# Patient Record
Sex: Male | Born: 1970
Health system: Southern US, Community
[De-identification: ages and names within clinical notes are randomized; demographics above are authoritative.]

## PROBLEM LIST (undated history)

## (undated) DIAGNOSIS — B019 Varicella without complication: Secondary | ICD-10-CM

## (undated) DIAGNOSIS — T7840XA Allergy, unspecified, initial encounter: Secondary | ICD-10-CM

## (undated) DIAGNOSIS — R7989 Other specified abnormal findings of blood chemistry: Secondary | ICD-10-CM

## (undated) DIAGNOSIS — I10 Essential (primary) hypertension: Secondary | ICD-10-CM

## (undated) DIAGNOSIS — F1011 Alcohol abuse, in remission: Secondary | ICD-10-CM

## (undated) DIAGNOSIS — F32A Depression, unspecified: Secondary | ICD-10-CM

## (undated) DIAGNOSIS — K5792 Diverticulitis of intestine, part unspecified, without perforation or abscess without bleeding: Secondary | ICD-10-CM

## (undated) DIAGNOSIS — F329 Major depressive disorder, single episode, unspecified: Secondary | ICD-10-CM

## (undated) HISTORY — DX: Essential (primary) hypertension: I10

## (undated) HISTORY — DX: Other specified abnormal findings of blood chemistry: R79.89

## (undated) HISTORY — DX: Depression, unspecified: F32.A

## (undated) HISTORY — DX: Varicella without complication: B01.9

## (undated) HISTORY — DX: Alcohol abuse, in remission: F10.11

## (undated) HISTORY — PX: ANKLE SURGERY: SHX546

## (undated) HISTORY — DX: Diverticulitis of intestine, part unspecified, without perforation or abscess without bleeding: K57.92

## (undated) HISTORY — DX: Allergy, unspecified, initial encounter: T78.40XA

## (undated) HISTORY — DX: Major depressive disorder, single episode, unspecified: F32.9

---

## 2014-09-17 ENCOUNTER — Other Ambulatory Visit: Payer: Self-pay | Admitting: Unknown Physician Specialty

## 2015-03-12 ENCOUNTER — Encounter: Payer: Self-pay | Admitting: Family Medicine

## 2015-03-12 ENCOUNTER — Ambulatory Visit (INDEPENDENT_AMBULATORY_CARE_PROVIDER_SITE_OTHER): Payer: Federal, State, Local not specified - PPO | Admitting: Family Medicine

## 2015-03-12 VITALS — BP 122/88 | HR 65 | Temp 97.9°F | Ht 75.5 in | Wt 265.5 lb

## 2015-03-12 DIAGNOSIS — Z130 Encounter for screening for diseases of the blood and blood-forming organs and certain disorders involving the immune mechanism: Secondary | ICD-10-CM

## 2015-03-12 DIAGNOSIS — M25511 Pain in right shoulder: Secondary | ICD-10-CM | POA: Diagnosis not present

## 2015-03-12 DIAGNOSIS — I10 Essential (primary) hypertension: Secondary | ICD-10-CM

## 2015-03-12 DIAGNOSIS — Z125 Encounter for screening for malignant neoplasm of prostate: Secondary | ICD-10-CM

## 2015-03-12 DIAGNOSIS — E669 Obesity, unspecified: Secondary | ICD-10-CM | POA: Diagnosis not present

## 2015-03-12 DIAGNOSIS — F32A Depression, unspecified: Secondary | ICD-10-CM | POA: Insufficient documentation

## 2015-03-12 DIAGNOSIS — F329 Major depressive disorder, single episode, unspecified: Secondary | ICD-10-CM

## 2015-03-12 DIAGNOSIS — Z13220 Encounter for screening for lipoid disorders: Secondary | ICD-10-CM | POA: Diagnosis not present

## 2015-03-12 DIAGNOSIS — Z0000 Encounter for general adult medical examination without abnormal findings: Secondary | ICD-10-CM | POA: Diagnosis not present

## 2015-03-12 DIAGNOSIS — R21 Rash and other nonspecific skin eruption: Secondary | ICD-10-CM | POA: Diagnosis not present

## 2015-03-12 DIAGNOSIS — R7989 Other specified abnormal findings of blood chemistry: Secondary | ICD-10-CM

## 2015-03-12 DIAGNOSIS — E291 Testicular hypofunction: Secondary | ICD-10-CM

## 2015-03-12 LAB — LIPID PANEL
CHOL/HDL RATIO: 5
Cholesterol: 194 mg/dL (ref 0–200)
HDL: 37.8 mg/dL — AB (ref >39.00)
LDL Cholesterol: 126 mg/dL — ABNORMAL HIGH (ref 0–99)
NONHDL: 155.8
Triglycerides: 150 mg/dL — ABNORMAL HIGH (ref 0.0–149.0)
VLDL: 30 mg/dL (ref 0.0–40.0)

## 2015-03-12 LAB — CBC
HEMATOCRIT: 48.3 % (ref 39.0–52.0)
HEMOGLOBIN: 16 g/dL (ref 13.0–17.0)
MCHC: 33.1 g/dL (ref 30.0–36.0)
MCV: 84.8 fl (ref 78.0–100.0)
Platelets: 278 10*3/uL (ref 150.0–400.0)
RBC: 5.7 Mil/uL (ref 4.22–5.81)
RDW: 14.4 % (ref 11.5–15.5)
WBC: 6.5 10*3/uL (ref 4.0–10.5)

## 2015-03-12 LAB — COMPREHENSIVE METABOLIC PANEL
ALK PHOS: 93 U/L (ref 39–117)
ALT: 29 U/L (ref 0–53)
AST: 23 U/L (ref 0–37)
Albumin: 4.7 g/dL (ref 3.5–5.2)
BUN: 25 mg/dL — ABNORMAL HIGH (ref 6–23)
CO2: 30 meq/L (ref 19–32)
Calcium: 9.9 mg/dL (ref 8.4–10.5)
Chloride: 102 mEq/L (ref 96–112)
Creatinine, Ser: 0.95 mg/dL (ref 0.40–1.50)
GFR: 90.56 mL/min (ref >60.00)
GLUCOSE: 107 mg/dL — AB (ref 70–99)
POTASSIUM: 5.2 meq/L — AB (ref 3.5–5.1)
Sodium: 140 mEq/L (ref 135–145)
Total Bilirubin: 0.6 mg/dL (ref 0.2–1.2)
Total Protein: 7.1 g/dL (ref 6.0–8.3)

## 2015-03-12 LAB — HEMOGLOBIN A1C: Hgb A1c MFr Bld: 5.9 % (ref 4.6–6.5)

## 2015-03-12 LAB — PSA: PSA: 0.98 ng/mL (ref 0.10–4.00)

## 2015-03-12 MED ORDER — BENAZEPRIL HCL 10 MG PO TABS: 10.0000 mg | ORAL_TABLET | Freq: Every day | ORAL | Status: DC

## 2015-03-12 MED ORDER — CITALOPRAM HYDROBROMIDE 20 MG PO TABS: 20.0000 mg | ORAL_TABLET | Freq: Every day | ORAL | Status: DC

## 2015-03-12 MED ORDER — BETAMETHASONE VALERATE 0.1 % EX OINT: 1.0000 "application " | TOPICAL_OINTMENT | Freq: Two times a day (BID) | CUTANEOUS | Status: DC

## 2015-03-12 MED ORDER — ETODOLAC 500 MG PO TABS: 500.0000 mg | ORAL_TABLET | Freq: Two times a day (BID) | ORAL | Status: DC | PRN

## 2015-03-12 NOTE — Patient Instructions (Addendum)
It was nice to see you today.  Take the celexa as prescribed.   Continue your Benazepril  Let me know about your testosterone stuff (I can manage it or you can go back to [facility]).  Use the ointment for you finger twice daily for the next 1-2 weeks.  Use the Etodolac as needed (sparingly).  Bernard Blake  Follow up:  Return in about 6 weeks (around 04/23/2015).  Take care  Dr. Lacinda Axon

## 2015-03-12 NOTE — Assessment & Plan Note (Signed)
Well-controlled. PHQ9 - 9. Starting Celexa. Patient to follow-up in 6 weeks.

## 2015-03-12 NOTE — Assessment & Plan Note (Signed)
Patient declined flu shot. Screening labs today: CBC, CMP, lipid, A1c. Obtaining PSA given patient has been on testosterone therapy. Not a candidate for tetanus immunization as he is allergic.

## 2015-03-12 NOTE — Progress Notes (Signed)
Subjective:  Patient ID: Bernard Blake, male    DOB: 1970-07-06  Age: 44 y.o. MRN: [id number]  CC: Establish care, Depression, Rash, Shoulder pain  HPI Bernard Blake is a 44 y.o. male presents to the clinic today to establish care. He also has additional complaints (see below).  Preventative Healthcare  Colonoscopy: N/A.  Immunizations  Tetanus - Allergic; not a candidate.  Pneumococcal - N/A.  Flu - Declined.  Prostate cancer screening: PSA today as patient has been on testosterone therapy.  Labs: In need of screening labs.  Exercise: Exercises regularly.  Alcohol use: Former alcoholic. Sober.  Smoking/tobacco use: Former smoker. Current uses dip/snuff.  Regular dental exams: Yes.   Wears seat belt: Yes.   Depression  Patient has long-standing depression.  He has been on Effexor in the past. He has been out of it for approximately 1 month.  He states that he has not had much benefit from Effexor.  Depression is currently not well controlled and interferes with his relationship with his wife.  He would like to discuss other options today.  Rash  Patient reports recent development of a rash on his right middle finger.  No known inciting factor.  He does not wear a ring on that finger.  He's been using a topical steroid that he was given previously with some improvement (he has not used this regularly).  He reports it is mildly itchy.  No other associated symptoms.  No exacerbating factors.  No new exposures.  Right shoulder pain  Patient reports long-standing history of intermittent right shoulder pain.  He states that associated with decreased range of motion.  Worsened with physical activity.  He states that he's had relief with anti-inflammatories in the past.  No recent fall, trauma, injury.  PMH, Surgical Hx, Family Hx, Social History reviewed and updated as below.  Past Medical History  Diagnosis Date  . Chicken pox   . History  of alcohol abuse   . Depression   . Hypertension   . Allergy   . Low testosterone    Past Surgical History  Procedure Laterality Date  . Ankle surgery Left     BB removal   Family History  Problem Relation Age of Onset  . Arthritis Mother   . Hyperlipidemia Mother   . Hypertension Mother   . Arthritis Father   . Hyperlipidemia Father   . Hypertension Father   . Diabetes Father    Social History  Substance Use Topics  . Smoking status: Former Smoker -- 1.00 packs/day for 8 years    Types: Cigarettes  . Smokeless tobacco: Current User    Types: Snuff     Comment: Quit smoking in 1995.  Marland Kitchen Alcohol Use: No     Comment: Former alcoholic; now sober    Review of Systems  Eyes: Positive for visual disturbance.  Musculoskeletal:       Shoulder pain.  Skin: Positive for rash.  Neurological: Positive for weakness.  Psychiatric/Behavioral:       Stress, Sadness.  All other systems reviewed and are negative.   Objective:   Today's Vitals: BP 122/88 mmHg  Pulse 65  Temp(Src) 97.9 F (36.6 C) (Oral)  Ht 6' 3.5" (1.918 m)  Wt 265 lb 8 oz (120.43 kg)  BMI 32.74 kg/m2  SpO2 95%  Physical Exam  Constitutional: He is oriented to person, place, and time. He appears well-developed and well-nourished. No distress.  HENT:  Head: Normocephalic and atraumatic.  Nose: Nose normal.  Mouth/Throat: Oropharynx is clear and moist. No oropharyngeal exudate.  Right TM obscured by cerumen. Left TM normal.  Eyes: Conjunctivae are normal. No scleral icterus.  Neck: Neck supple.  Cardiovascular: Normal rate and regular rhythm.   No murmur heard. Pulmonary/Chest: Effort normal and breath sounds normal. He has no wheezes. He has no rales.  Abdominal: Soft. He exhibits no distension. There is no tenderness. There is no rebound and no guarding.  Musculoskeletal:  Shoulder: Inspection reveals no abnormalities, atrophy or asymmetry. Palpation is normal. ROM - Decreased in  extension. Rotator cuff strength normal throughout. No signs of impingement with negative Neer and Hawkin's tests, empty can. Normal scapular function observed. No painful arc sign.    Lymphadenopathy:    He has no cervical adenopathy.  Neurological: He is alert and oriented to person, place, and time.  Skin:  Right middle finger - dry papular rash.  Psychiatric: He has a normal mood and affect.  Vitals reviewed.  PHQ-9 - 9.  Assessment & Plan:   Problem List Items Addressed This Visit    Depression    Well-controlled. PHQ9 - 9. Starting Celexa. Patient to follow-up in 6 weeks.      Relevant Medications   citalopram (CELEXA) 20 MG tablet   Essential hypertension    Well-controlled. Continuing benazepril.      Relevant Medications   benazepril (LOTENSIN) 10 MG tablet   Other Relevant Orders   Comp Met (CMET) (Completed)   Low testosterone   Obesity (BMI 30.0-34.9)   Relevant Orders   HgB A1c (Completed)   Preventative health care - Primary    Patient declined flu shot. Screening labs today: CBC, CMP, lipid, A1c. Obtaining PSA given patient has been on testosterone therapy. Not a candidate for tetanus immunization as he is allergic.       Rash    New problem. Treating with Betamethasone.      Right shoulder pain    Exam negative for impingement. No evidence of cuff tear. Treating with Etodolac PRN.       Other Visit Diagnoses    Screening for deficiency anemia        Relevant Orders    CBC (Completed)    Screening, lipid        Relevant Orders    Lipid panel (Completed)    Screening for prostate cancer        Relevant Orders    PSA (Completed)       Outpatient Encounter Prescriptions as of 03/12/2015  Medication Sig  . Ascorbic Acid (VITAMIN C) 1000 MG tablet Take 2,000 mg by mouth daily.  . benazepril (LOTENSIN) 10 MG tablet Take 1 tablet (10 mg total) by mouth daily.  . cholecalciferol (VITAMIN D) 1000 UNITS tablet Take 2,000 Units by  mouth daily.  Marland Kitchen glucosamine-chondroitin 500-400 MG tablet Take 1 tablet by mouth 3 (three) times daily.  . Omega-3 Fatty Acids (FISH OIL) 1000 MG CAPS Take 2,400 mg by mouth once.  . testosterone (ANDRODERM) 4 MG/24HR PT24 patch Place 1 patch onto the skin daily.  . Zinc Sulfate (ZINC 15) 66 MG TABS Take 15 mg by mouth.  . [DISCONTINUED] benazepril (LOTENSIN) 10 MG tablet Take 10 mg by mouth daily.  . [DISCONTINUED] venlafaxine XR (EFFEXOR-XR) 75 MG 24 hr capsule TAKE ONE CAPSULE BY MOUTH EVERY DAY  . betamethasone valerate ointment (VALISONE) 0.1 % Apply 1 application topically 2 (two) times daily. For 1 week. Do not use more than 2 weeks consecutively.  Marland Kitchen  citalopram (CELEXA) 20 MG tablet Take 1 tablet (20 mg total) by mouth daily.  Marland Kitchen etodolac (LODINE) 500 MG tablet Take 1 tablet (500 mg total) by mouth 2 (two) times daily as needed.   No facility-administered encounter medications on file as of 03/12/2015.    Follow-up: Return in about 6 weeks (around 04/23/2015).  [place]

## 2015-03-12 NOTE — Assessment & Plan Note (Signed)
New problem. Treating with Betamethasone.  

## 2015-03-12 NOTE — Assessment & Plan Note (Signed)
Exam negative for impingement. No evidence of cuff tear. Treating with Etodolac PRN.

## 2015-03-12 NOTE — Assessment & Plan Note (Signed)
Well-controlled. Continuing benazepril.

## 2015-04-23 ENCOUNTER — Ambulatory Visit (INDEPENDENT_AMBULATORY_CARE_PROVIDER_SITE_OTHER): Payer: Federal, State, Local not specified - PPO | Admitting: Family Medicine

## 2015-04-23 ENCOUNTER — Encounter: Payer: Self-pay | Admitting: Family Medicine

## 2015-04-23 VITALS — BP 122/78 | HR 60 | Temp 97.5°F | Ht 75.5 in | Wt 257.1 lb

## 2015-04-23 DIAGNOSIS — F329 Major depressive disorder, single episode, unspecified: Secondary | ICD-10-CM | POA: Diagnosis not present

## 2015-04-23 DIAGNOSIS — I10 Essential (primary) hypertension: Secondary | ICD-10-CM | POA: Diagnosis not present

## 2015-04-23 DIAGNOSIS — R21 Rash and other nonspecific skin eruption: Secondary | ICD-10-CM | POA: Diagnosis not present

## 2015-04-23 DIAGNOSIS — F32A Depression, unspecified: Secondary | ICD-10-CM

## 2015-04-23 MED ORDER — BETAMETHASONE VALERATE 0.1 % EX OINT: 1.0000 "application " | TOPICAL_OINTMENT | Freq: Two times a day (BID) | CUTANEOUS | Status: DC

## 2015-04-23 NOTE — Progress Notes (Signed)
Subjective:  Patient ID: Bernard Blake, male    DOB: 05-31-1970  Age: 45 y.o. MRN: [id number]  CC: Follow up Depression, HTN, Rash  HPI:  45 year old male with a past medical history of hypertension and depression presents for follow-up.  HTN  Well controlled.  Doing well on Benazepril.  Depression  There are last visit patient was switched from Effexor to Celexa.  He states that he's doing well currently.  He says that he's had a few days that is been bad but generally he is doing well.  Tolerating the Celexa without difficulty.  Rash  Patient continues to have a rash on his right middle finger.  He did not pick up the prescription that was given at our last visit.  He has been using a prior topical steroid with some improvement.  Social Hx   Social History   Social History  . Marital Status: Married    Spouse Name: N/A  . Number of Children: N/A  . Years of Education: N/A   Social History Main Topics  . Smoking status: Former Smoker -- 1.00 packs/day for 8 years    Types: Cigarettes  . Smokeless tobacco: Current User    Types: Snuff     Comment: Quit smoking in 1995.  Marland Kitchen Alcohol Use: No     Comment: Former alcoholic; now sober  . Drug Use: No  . Sexual Activity: Yes   Other Topics Concern  . None   Social History Narrative   Review of Systems  Constitutional: Negative.   Respiratory: Negative.   Cardiovascular: Negative.   Skin: Positive for rash.   Objective:  BP 122/78 mmHg  Pulse 60  Temp(Src) 97.5 F (36.4 C) (Oral)  Ht 6' 3.5" (1.918 m)  Wt 257 lb 2 oz (116.631 kg)  BMI 31.70 kg/m2  SpO2 97%  BP/Weight 04/23/2015 [id number]  Systolic BP 123XX123 123XX123  Diastolic BP 78 88  Wt. (Lbs) 257.13 265.5  BMI 31.7 32.74   Physical Exam  Constitutional: He appears well-developed. No distress.  Cardiovascular: Normal rate and regular rhythm.   No murmur heard. Pulmonary/Chest: Effort normal and breath sounds normal. No respiratory distress.    Skin:  Papular dry rash noted on Right middle finger.  Psychiatric:  Normal Mood and affect.  Vitals reviewed.  Lab Results  Component Value Date   WBC 6.5 03/12/2015   HGB 16.0 03/12/2015   HCT 48.3 03/12/2015   PLT 278.0 03/12/2015   GLUCOSE 107* 03/12/2015   CHOL 194 03/12/2015   TRIG 150.0* 03/12/2015   HDL 37.80* 03/12/2015   LDLCALC 126* 03/12/2015   ALT 29 03/12/2015   AST 23 03/12/2015   NA 140 03/12/2015   K 5.2* 03/12/2015   CL 102 03/12/2015   CREATININE 0.95 03/12/2015   BUN 25* 03/12/2015   CO2 30 03/12/2015   PSA 0.98 03/12/2015   HGBA1C 5.9 03/12/2015    Assessment & Plan:   Problem List Items Addressed This Visit    Depression - Primary    Stable and doing well at this time. Continue Celexa.      Essential hypertension    Well controlled on benazepril. Will continue.      Rash    Established problem, not improved. We will resend in prescription for betamethasone.         Meds ordered this encounter  Medications  . Co-Enzyme Q-10 100 MG CAPS    Sig: Take 100 mg by mouth.  . betamethasone valerate ointment (VALISONE)  0.1 %    Sig: Apply 1 application topically 2 (two) times daily. For 1 week. Do not use more than 2 weeks consecutively.    Dispense:  30 g    Refill:  0    Follow-up: 6 months - 1 year.   [place]

## 2015-04-23 NOTE — Assessment & Plan Note (Signed)
Established problem, not improved. We will resend in prescription for betamethasone.

## 2015-04-23 NOTE — Progress Notes (Signed)
Pre visit review using our clinic review tool, if applicable. No additional management support is needed unless otherwise documented below in the visit note. 

## 2015-04-23 NOTE — Patient Instructions (Signed)
Continue your current medications.  You're doing great.  Follow up in 6 months to 1 year.   Take care  Dr. Lacinda Axon

## 2015-04-23 NOTE — Assessment & Plan Note (Signed)
Stable and doing well at this time. Continue Celexa.

## 2015-04-23 NOTE — Assessment & Plan Note (Signed)
Well controlled on benazepril. Will continue.

## 2015-06-30 ENCOUNTER — Encounter: Payer: Self-pay | Admitting: Family Medicine

## 2015-06-30 ENCOUNTER — Other Ambulatory Visit: Payer: Self-pay | Admitting: Family Medicine

## 2015-06-30 MED ORDER — BENAZEPRIL HCL 10 MG PO TABS: 10.0000 mg | ORAL_TABLET | Freq: Every day | ORAL | Status: DC

## 2015-06-30 MED ORDER — CITALOPRAM HYDROBROMIDE 20 MG PO TABS: 20.0000 mg | ORAL_TABLET | Freq: Every day | ORAL | Status: DC

## 2015-07-14 ENCOUNTER — Telehealth: Payer: Self-pay | Admitting: *Deleted

## 2015-07-14 MED ORDER — ETODOLAC 500 MG PO TABS: 500.0000 mg | ORAL_TABLET | Freq: Two times a day (BID) | ORAL | Status: DC | PRN

## 2015-07-14 NOTE — Telephone Encounter (Signed)
Rx refilled.

## 2015-07-14 NOTE — Telephone Encounter (Signed)
Patient requested a medication refill for etodolac, please contact pt when completed Pharmacy [facility] on [address]

## 2015-07-16 DIAGNOSIS — B353 Tinea pedis: Secondary | ICD-10-CM | POA: Diagnosis not present

## 2015-07-16 DIAGNOSIS — M71372 Other bursal cyst, left ankle and foot: Secondary | ICD-10-CM | POA: Diagnosis not present

## 2015-11-11 DIAGNOSIS — N5201 Erectile dysfunction due to arterial insufficiency: Secondary | ICD-10-CM | POA: Diagnosis not present

## 2015-11-11 DIAGNOSIS — Z79899 Other long term (current) drug therapy: Secondary | ICD-10-CM | POA: Diagnosis not present

## 2015-11-11 DIAGNOSIS — R350 Frequency of micturition: Secondary | ICD-10-CM | POA: Diagnosis not present

## 2015-11-11 DIAGNOSIS — E291 Testicular hypofunction: Secondary | ICD-10-CM | POA: Diagnosis not present

## 2015-11-11 DIAGNOSIS — N401 Enlarged prostate with lower urinary tract symptoms: Secondary | ICD-10-CM | POA: Diagnosis not present

## 2015-11-11 DIAGNOSIS — N421 Congestion and hemorrhage of prostate: Secondary | ICD-10-CM | POA: Diagnosis not present

## 2016-02-10 ENCOUNTER — Encounter: Payer: Self-pay | Admitting: Family Medicine

## 2016-02-10 ENCOUNTER — Other Ambulatory Visit: Payer: Self-pay | Admitting: Family Medicine

## 2016-02-10 MED ORDER — IBUPROFEN 800 MG PO TABS
800.0000 mg | ORAL_TABLET | Freq: Three times a day (TID) | ORAL | 0 refills | Status: DC | PRN
Start: 2016-02-10 — End: 2016-04-01

## 2016-02-19 ENCOUNTER — Other Ambulatory Visit: Payer: Self-pay | Admitting: Family Medicine

## 2016-04-01 ENCOUNTER — Other Ambulatory Visit: Payer: Self-pay | Admitting: Family Medicine

## 2016-05-13 DIAGNOSIS — N401 Enlarged prostate with lower urinary tract symptoms: Secondary | ICD-10-CM | POA: Diagnosis not present

## 2016-05-13 DIAGNOSIS — E291 Testicular hypofunction: Secondary | ICD-10-CM | POA: Diagnosis not present

## 2016-05-13 DIAGNOSIS — Z79899 Other long term (current) drug therapy: Secondary | ICD-10-CM | POA: Diagnosis not present

## 2016-05-13 DIAGNOSIS — N5201 Erectile dysfunction due to arterial insufficiency: Secondary | ICD-10-CM | POA: Diagnosis not present

## 2016-06-25 ENCOUNTER — Other Ambulatory Visit: Payer: Self-pay | Admitting: Family Medicine

## 2016-07-10 ENCOUNTER — Other Ambulatory Visit: Payer: Self-pay | Admitting: Family Medicine

## 2016-07-11 ENCOUNTER — Other Ambulatory Visit: Payer: Self-pay | Admitting: Family Medicine

## 2016-07-13 ENCOUNTER — Encounter: Payer: Federal, State, Local not specified - PPO | Admitting: Family Medicine

## 2016-08-04 ENCOUNTER — Telehealth: Payer: Self-pay | Admitting: Family Medicine

## 2016-08-04 ENCOUNTER — Encounter: Payer: Federal, State, Local not specified - PPO | Admitting: Family Medicine

## 2016-08-04 DIAGNOSIS — Z0289 Encounter for other administrative examinations: Secondary | ICD-10-CM

## 2016-08-04 NOTE — Telephone Encounter (Signed)
FYI - Pt called and stated that he had something come up at work and missed appointment.

## 2016-08-04 NOTE — Telephone Encounter (Signed)
Pt will be no showed due to him not calling until 9 minutes ago.

## 2016-08-30 ENCOUNTER — Encounter: Payer: Federal, State, Local not specified - PPO | Admitting: Family Medicine

## 2016-09-15 ENCOUNTER — Ambulatory Visit (INDEPENDENT_AMBULATORY_CARE_PROVIDER_SITE_OTHER): Payer: Federal, State, Local not specified - PPO | Admitting: Family Medicine

## 2016-09-15 ENCOUNTER — Encounter: Payer: Self-pay | Admitting: Family Medicine

## 2016-09-15 VITALS — BP 124/86 | HR 54 | Temp 97.7°F | Resp 16 | Ht 74.0 in | Wt 255.4 lb

## 2016-09-15 DIAGNOSIS — Z0000 Encounter for general adult medical examination without abnormal findings: Secondary | ICD-10-CM | POA: Diagnosis not present

## 2016-09-15 DIAGNOSIS — M71372 Other bursal cyst, left ankle and foot: Secondary | ICD-10-CM | POA: Diagnosis not present

## 2016-09-15 LAB — CBC
HEMATOCRIT: 47.4 % (ref 39.0–52.0)
HEMOGLOBIN: 15.9 g/dL (ref 13.0–17.0)
MCHC: 33.7 g/dL (ref 30.0–36.0)
MCV: 84.5 fl (ref 78.0–100.0)
Platelets: 263 10*3/uL (ref 150.0–400.0)
RBC: 5.6 Mil/uL (ref 4.22–5.81)
RDW: 14 % (ref 11.5–15.5)
WBC: 6 10*3/uL (ref 4.0–10.5)

## 2016-09-15 LAB — COMPREHENSIVE METABOLIC PANEL
ALT: 27 U/L (ref 0–53)
AST: 22 U/L (ref 0–37)
Albumin: 4.8 g/dL (ref 3.5–5.2)
Alkaline Phosphatase: 86 U/L (ref 39–117)
BUN: 20 mg/dL (ref 6–23)
CHLORIDE: 100 meq/L (ref 96–112)
CO2: 33 mEq/L — ABNORMAL HIGH (ref 19–32)
Calcium: 9.8 mg/dL (ref 8.4–10.5)
Creatinine, Ser: 0.9 mg/dL (ref 0.40–1.50)
GFR: 95.76 mL/min (ref >60.00)
GLUCOSE: 114 mg/dL — AB (ref 70–99)
POTASSIUM: 4.4 meq/L (ref 3.5–5.1)
SODIUM: 137 meq/L (ref 135–145)
Total Bilirubin: 0.6 mg/dL (ref 0.2–1.2)
Total Protein: 7.1 g/dL (ref 6.0–8.3)

## 2016-09-15 LAB — HEMOGLOBIN A1C: Hgb A1c MFr Bld: 5.8 % (ref 4.6–6.5)

## 2016-09-15 LAB — LIPID PANEL
Cholesterol: 175 mg/dL (ref 0–200)
HDL: 43.5 mg/dL (ref >39.00)
LDL CALC: 110 mg/dL — AB (ref 0–99)
NONHDL: 131.09
Total CHOL/HDL Ratio: 4
Triglycerides: 103 mg/dL (ref 0.0–149.0)
VLDL: 20.6 mg/dL (ref 0.0–40.0)

## 2016-09-15 MED ORDER — IBUPROFEN 800 MG PO TABS: 800.0000 mg | ORAL_TABLET | Freq: Three times a day (TID) | ORAL | 1 refills | Status: DC | PRN

## 2016-09-15 MED ORDER — CITALOPRAM HYDROBROMIDE 20 MG PO TABS: ORAL_TABLET | ORAL | 3 refills | Status: DC

## 2016-09-15 MED ORDER — BENAZEPRIL HCL 10 MG PO TABS: 10.0000 mg | ORAL_TABLET | Freq: Every day | ORAL | 3 refills | Status: DC

## 2016-09-15 NOTE — Progress Notes (Addendum)
Subjective:  Patient ID: Bernard Blake, male    DOB: [birth year]  Age: 46 y.o. MRN: [id number]  CC: Annual physical  HPI Bernard Blake is a 46 y.o. male presents to the clinic today for an annual physical exam.  Preventative Healthcare  Colonoscopy: Not yet indicated.  Immunizations - Up to date.  Prostate cancer screening: See urology.   Labs: Labs today.  Exercise: Exercises regularly.  Alcohol use: See below.  Smoking/tobacco use: Former smoker.  STD/HIV testing: Declines.  PMH, Surgical Hx, Family Hx, Social History reviewed and updated as below.  Past Medical History:  Diagnosis Date  . Allergy   . Chicken pox   . Depression   . History of alcohol abuse   . Hypertension   . Low testosterone    Past Surgical History:  Procedure Laterality Date  . ANKLE SURGERY Left    BB removal   Family History  Problem Relation Age of Onset  . Arthritis Mother   . Hyperlipidemia Mother   . Hypertension Mother   . Arthritis Father   . Hyperlipidemia Father   . Hypertension Father   . Diabetes Father    Social History  Substance Use Topics  . Smoking status: Former Smoker    Packs/day: 1.00    Years: 8.00    Types: Cigarettes  . Smokeless tobacco: Current User    Types: Snuff     Comment: Quit smoking in 1995.  Marland Kitchen Alcohol use No     Comment: Former alcoholic; now sober    Review of Systems  Musculoskeletal: Positive for arthralgias.  Skin:       Lesion on Left 2nd toe.   Psychiatric/Behavioral:       Sadness.  All other systems reviewed and are negative.  Objective:   Today's Vitals: BP 124/86 (BP Location: Left Arm, Patient Position: Sitting, Cuff Size: Large)   Pulse (!) 54   Temp 97.7 F (36.5 C) (Oral)   Resp 16   Ht 6\' 2"  (1.88 m)   Wt 255 lb 6 oz (115.8 kg)   SpO2 95%   BMI 32.79 kg/m   Physical Exam  Constitutional: He is oriented to person, place, and time. He appears well-developed and well-nourished. No distress.  HENT:  Head:  Normocephalic and atraumatic.  Nose: Nose normal.  Mouth/Throat: Oropharynx is clear and moist. No oropharyngeal exudate.  Normal TM's bilaterally.   Eyes: Conjunctivae are normal. No scleral icterus.  Neck: Neck supple.  Cardiovascular: Normal rate and regular rhythm.   No murmur heard. Pulmonary/Chest: Effort normal and breath sounds normal. He has no wheezes. He has no rales.  Abdominal: Soft. He exhibits no distension. There is no tenderness. There is no rebound and no guarding.  Musculoskeletal: Normal range of motion. He exhibits no edema.  Lymphadenopathy:    He has no cervical adenopathy.  Neurological: He is alert and oriented to person, place, and time.  Skin: Skin is warm and dry. No rash noted.  Psychiatric: He has a normal mood and affect.  Vitals reviewed.  Assessment & Plan:   Problem List Items Addressed This Visit      Other   Annual physical exam - Primary    Preventative healthcare up-to-date. Labs today. Medications refilled.      Relevant Orders   CBC   Hemoglobin A1c   Comprehensive metabolic panel   Lipid panel      Meds ordered this encounter  Medications  . DISCONTD: ibuprofen (ADVIL,MOTRIN) 800 MG tablet  Sig: Take 800 mg by mouth every 8 (eight) hours as needed.  . benazepril (LOTENSIN) 10 MG tablet    Sig: Take 1 tablet (10 mg total) by mouth daily.    Dispense:  90 tablet    Refill:  3  . citalopram (CELEXA) 20 MG tablet    Sig: TAKE 1 TABLET(20 MG) BY MOUTH DAILY    Dispense:  90 tablet    Refill:  3  . ibuprofen (ADVIL,MOTRIN) 800 MG tablet    Sig: Take 1 tablet (800 mg total) by mouth every 8 (eight) hours as needed.    Dispense:  90 tablet    Refill:  1    Follow-up: Annually  [place]

## 2016-09-15 NOTE — Patient Instructions (Addendum)
Continue your meds.  Follow up annually.  Take care  Dr. Lacinda Axon     Health Maintenance, Male A healthy lifestyle and preventive care is important for your health and wellness. Ask your health care provider about what schedule of regular examinations is right for you. What should I know about weight and diet? Eat a Healthy Diet  Eat plenty of vegetables, fruits, whole grains, low-fat dairy products, and lean protein.  Do not eat a lot of foods high in solid fats, added sugars, or salt.  Maintain a Healthy Weight Regular exercise can help you achieve or maintain a healthy weight. You should:  Do at least 150 minutes of exercise each week. The exercise should increase your heart rate and make you sweat (moderate-intensity exercise).  Do strength-training exercises at least twice a week.  Watch Your Levels of Cholesterol and Blood Lipids  Have your blood tested for lipids and cholesterol every 5 years starting at 48 years of age. If you are at high risk for heart disease, you should start having your blood tested when you are 48 years old. You may need to have your cholesterol levels checked more often if: ? Your lipid or cholesterol levels are high. ? You are older than 48 years of age. ? You are at high risk for heart disease.  What should I know about cancer screening? Many types of cancers can be detected early and may often be prevented. Lung Cancer  You should be screened every year for lung cancer if: ? You are a current smoker who has smoked for at least 30 years. ? You are a former smoker who has quit within the past 15 years.  Talk to your health care provider about your screening options, when you should start screening, and how often you should be screened.  Colorectal Cancer  Routine colorectal cancer screening usually begins at 48 years of age and should be repeated every 5-10 years until you are 48 years old. You may need to be screened more often if early forms  of precancerous polyps or small growths are found. Your health care provider may recommend screening at an earlier age if you have risk factors for colon cancer.  Your health care provider may recommend using home test kits to check for hidden blood in the stool.  A small camera at the end of a tube can be used to examine your colon (sigmoidoscopy or colonoscopy). This checks for the earliest forms of colorectal cancer.  Prostate and Testicular Cancer  Depending on your age and overall health, your health care provider may do certain tests to screen for prostate and testicular cancer.  Talk to your health care provider about any symptoms or concerns you have about testicular or prostate cancer.  Skin Cancer  Check your skin from head to toe regularly.  Tell your health care provider about any new moles or changes in moles, especially if: ? There is a change in a mole's size, shape, or color. ? You have a mole that is larger than a pencil eraser.  Always use sunscreen. Apply sunscreen liberally and repeat throughout the day.  Protect yourself by wearing long sleeves, pants, a wide-brimmed hat, and sunglasses when outside.  What should I know about heart disease, diabetes, and high blood pressure?  If you are 74-38 years of age, have your blood pressure checked every 3-5 years. If you are 6 years of age or older, have your blood pressure checked every year. You should  have your blood pressure measured twice-once when you are at a hospital or clinic, and once when you are not at a hospital or clinic. Record the average of the two measurements. To check your blood pressure when you are not at a hospital or clinic, you can use: ? An automated blood pressure machine at a pharmacy. ? A home blood pressure monitor.  Talk to your health care provider about your target blood pressure.  If you are between 35-35 years old, ask your health care provider if you should take aspirin to prevent heart  disease.  Have regular diabetes screenings by checking your fasting blood sugar level. ? If you are at a normal weight and have a low risk for diabetes, have this test once every three years after the age of 72. ? If you are overweight and have a high risk for diabetes, consider being tested at a younger age or more often.  A one-time screening for abdominal aortic aneurysm (AAA) by ultrasound is recommended for men aged 20-75 years who are current or former smokers. What should I know about preventing infection? Hepatitis B If you have a higher risk for hepatitis B, you should be screened for this virus. Talk with your health care provider to find out if you are at risk for hepatitis B infection. Hepatitis C Blood testing is recommended for:  Everyone born from [birth year] through 1967.  Anyone with known risk factors for hepatitis C.  Sexually Transmitted Diseases (STDs)  You should be screened each year for STDs including gonorrhea and chlamydia if: ? You are sexually active and are younger than 48 years of age. ? You are older than 48 years of age and your health care provider tells you that you are at risk for this type of infection. ? Your sexual activity has changed since you were last screened and you are at an increased risk for chlamydia or gonorrhea. Ask your health care provider if you are at risk.  Talk with your health care provider about whether you are at high risk of being infected with HIV. Your health care provider may recommend a prescription medicine to help prevent HIV infection.  What else can I do?  Schedule regular health, dental, and eye exams.  Stay current with your vaccines (immunizations).  Do not use any tobacco products, such as cigarettes, chewing tobacco, and e-cigarettes. If you need help quitting, ask your health care provider.  Limit alcohol intake to no more than 2 drinks per day. One drink equals 12 ounces of beer, 5 ounces of wine, or 1 ounces of  hard liquor.  Do not use street drugs.  Do not share needles.  Ask your health care provider for help if you need support or information about quitting drugs.  Tell your health care provider if you often feel depressed.  Tell your health care provider if you have ever been abused or do not feel safe at home. This information is not intended to replace advice given to you by your health care provider. Make sure you discuss any questions you have with your health care provider. Document Released: 09/04/2007 Document Revised: 11/05/2015 Document Reviewed: 12/10/2014 Elsevier Interactive Patient Education  [employer].

## 2016-09-15 NOTE — Assessment & Plan Note (Signed)
Preventative healthcare up-to-date. Labs today. Medications refilled.

## 2016-09-30 ENCOUNTER — Encounter: Payer: Self-pay | Admitting: *Deleted

## 2016-09-30 ENCOUNTER — Telehealth: Payer: Self-pay | Admitting: Family Medicine

## 2016-09-30 ENCOUNTER — Ambulatory Visit (INDEPENDENT_AMBULATORY_CARE_PROVIDER_SITE_OTHER): Payer: Federal, State, Local not specified - PPO | Admitting: *Deleted

## 2016-09-30 DIAGNOSIS — H6120 Impacted cerumen, unspecified ear: Secondary | ICD-10-CM

## 2016-09-30 NOTE — Telephone Encounter (Signed)
No problem. I'm happy to look.

## 2016-09-30 NOTE — Progress Notes (Signed)
Patient came in for ear irrigation today to bilateral ears no order had PCP inspect ears and received verbal for bilateral irrigation for impacted cerumen , after irrigating the right ear for several minutes was not able to clear cerumen had PCP re-evaluate his ear  And advised patient to iether come after using debrox or ENT referral patient wanted to return for nurse visit in 2 weeks.

## 2016-09-30 NOTE — Progress Notes (Signed)
Debrox trial with repeat irrigation.  [place]

## 2016-09-30 NOTE — Telephone Encounter (Signed)
Patient coming to office for ear irrigation nothing in note about cerumen impaction from last visit please advise . Need you to look at ears for cerumen and give order  09/15/16 was date of last visit.

## 2016-10-19 ENCOUNTER — Ambulatory Visit (INDEPENDENT_AMBULATORY_CARE_PROVIDER_SITE_OTHER): Payer: Federal, State, Local not specified - PPO

## 2016-10-19 DIAGNOSIS — M7732 Calcaneal spur, left foot: Secondary | ICD-10-CM | POA: Diagnosis not present

## 2016-10-19 DIAGNOSIS — M722 Plantar fascial fibromatosis: Secondary | ICD-10-CM | POA: Diagnosis not present

## 2016-10-19 DIAGNOSIS — M79672 Pain in left foot: Secondary | ICD-10-CM | POA: Diagnosis not present

## 2016-10-19 DIAGNOSIS — H6120 Impacted cerumen, unspecified ear: Secondary | ICD-10-CM

## 2016-10-19 DIAGNOSIS — M6740 Ganglion, unspecified site: Secondary | ICD-10-CM | POA: Diagnosis not present

## 2016-10-19 NOTE — Progress Notes (Signed)
Patient comes in today with bilateral ear cerumen impaction. Patient was seen on 09/30/16 for irrigation Was given debrox for him to use and after several weeks patient was able to get have it removed. It took patient 1 attempt on the right ear and 2 attempts on the left ear. Patient tolerated procedure well. Stated there was no pain after procedure and advise patient he may want to consult with Dr. Lacinda Axon on his next office visit to see ENT.

## 2016-10-20 NOTE — Progress Notes (Signed)
Care was provided under my supervision. I agree with the management as indicated in the note.  Iness Pangilinan DO  

## 2016-11-01 DIAGNOSIS — M778 Other enthesopathies, not elsewhere classified: Secondary | ICD-10-CM | POA: Diagnosis not present

## 2016-11-30 DIAGNOSIS — M659 Synovitis and tenosynovitis, unspecified: Secondary | ICD-10-CM | POA: Diagnosis not present

## 2016-11-30 DIAGNOSIS — M7672 Peroneal tendinitis, left leg: Secondary | ICD-10-CM | POA: Diagnosis not present

## 2016-11-30 DIAGNOSIS — M6740 Ganglion, unspecified site: Secondary | ICD-10-CM | POA: Diagnosis not present

## 2017-03-07 DIAGNOSIS — J329 Chronic sinusitis, unspecified: Secondary | ICD-10-CM | POA: Diagnosis not present

## 2017-03-10 ENCOUNTER — Ambulatory Visit: Payer: Federal, State, Local not specified - PPO | Admitting: Internal Medicine

## 2017-05-25 DIAGNOSIS — R351 Nocturia: Secondary | ICD-10-CM | POA: Diagnosis not present

## 2017-05-25 DIAGNOSIS — Z79899 Other long term (current) drug therapy: Secondary | ICD-10-CM | POA: Diagnosis not present

## 2017-05-25 DIAGNOSIS — N401 Enlarged prostate with lower urinary tract symptoms: Secondary | ICD-10-CM | POA: Diagnosis not present

## 2017-05-25 DIAGNOSIS — E291 Testicular hypofunction: Secondary | ICD-10-CM | POA: Diagnosis not present

## 2017-05-25 DIAGNOSIS — N5201 Erectile dysfunction due to arterial insufficiency: Secondary | ICD-10-CM | POA: Diagnosis not present

## 2017-08-01 DIAGNOSIS — M9905 Segmental and somatic dysfunction of pelvic region: Secondary | ICD-10-CM | POA: Diagnosis not present

## 2017-08-01 DIAGNOSIS — M9903 Segmental and somatic dysfunction of lumbar region: Secondary | ICD-10-CM | POA: Diagnosis not present

## 2017-08-01 DIAGNOSIS — M5136 Other intervertebral disc degeneration, lumbar region: Secondary | ICD-10-CM | POA: Diagnosis not present

## 2017-08-01 DIAGNOSIS — M62830 Muscle spasm of back: Secondary | ICD-10-CM | POA: Diagnosis not present

## 2017-08-05 DIAGNOSIS — M9903 Segmental and somatic dysfunction of lumbar region: Secondary | ICD-10-CM | POA: Diagnosis not present

## 2017-08-05 DIAGNOSIS — M9905 Segmental and somatic dysfunction of pelvic region: Secondary | ICD-10-CM | POA: Diagnosis not present

## 2017-08-05 DIAGNOSIS — M5136 Other intervertebral disc degeneration, lumbar region: Secondary | ICD-10-CM | POA: Diagnosis not present

## 2017-08-05 DIAGNOSIS — M62830 Muscle spasm of back: Secondary | ICD-10-CM | POA: Diagnosis not present

## 2017-08-17 DIAGNOSIS — M9903 Segmental and somatic dysfunction of lumbar region: Secondary | ICD-10-CM | POA: Diagnosis not present

## 2017-08-17 DIAGNOSIS — M5136 Other intervertebral disc degeneration, lumbar region: Secondary | ICD-10-CM | POA: Diagnosis not present

## 2017-08-17 DIAGNOSIS — M9905 Segmental and somatic dysfunction of pelvic region: Secondary | ICD-10-CM | POA: Diagnosis not present

## 2017-08-17 DIAGNOSIS — M62830 Muscle spasm of back: Secondary | ICD-10-CM | POA: Diagnosis not present

## 2017-08-25 DIAGNOSIS — M9903 Segmental and somatic dysfunction of lumbar region: Secondary | ICD-10-CM | POA: Diagnosis not present

## 2017-08-25 DIAGNOSIS — M9905 Segmental and somatic dysfunction of pelvic region: Secondary | ICD-10-CM | POA: Diagnosis not present

## 2017-08-25 DIAGNOSIS — M62830 Muscle spasm of back: Secondary | ICD-10-CM | POA: Diagnosis not present

## 2017-08-25 DIAGNOSIS — M5136 Other intervertebral disc degeneration, lumbar region: Secondary | ICD-10-CM | POA: Diagnosis not present

## 2017-09-12 DIAGNOSIS — M9903 Segmental and somatic dysfunction of lumbar region: Secondary | ICD-10-CM | POA: Diagnosis not present

## 2017-09-12 DIAGNOSIS — M5136 Other intervertebral disc degeneration, lumbar region: Secondary | ICD-10-CM | POA: Diagnosis not present

## 2017-09-12 DIAGNOSIS — M9905 Segmental and somatic dysfunction of pelvic region: Secondary | ICD-10-CM | POA: Diagnosis not present

## 2017-09-12 DIAGNOSIS — M62830 Muscle spasm of back: Secondary | ICD-10-CM | POA: Diagnosis not present

## 2017-09-14 DIAGNOSIS — M71372 Other bursal cyst, left ankle and foot: Secondary | ICD-10-CM | POA: Diagnosis not present

## 2017-09-14 DIAGNOSIS — D2272 Melanocytic nevi of left lower limb, including hip: Secondary | ICD-10-CM | POA: Diagnosis not present

## 2017-09-14 DIAGNOSIS — D2262 Melanocytic nevi of left upper limb, including shoulder: Secondary | ICD-10-CM | POA: Diagnosis not present

## 2017-09-14 DIAGNOSIS — D2261 Melanocytic nevi of right upper limb, including shoulder: Secondary | ICD-10-CM | POA: Diagnosis not present

## 2017-09-14 DIAGNOSIS — D225 Melanocytic nevi of trunk: Secondary | ICD-10-CM | POA: Diagnosis not present

## 2017-10-17 ENCOUNTER — Other Ambulatory Visit: Payer: Self-pay | Admitting: Family Medicine

## 2017-10-21 ENCOUNTER — Other Ambulatory Visit: Payer: Self-pay | Admitting: Family Medicine

## 2017-11-01 DIAGNOSIS — N401 Enlarged prostate with lower urinary tract symptoms: Secondary | ICD-10-CM | POA: Diagnosis not present

## 2017-11-01 DIAGNOSIS — Z79899 Other long term (current) drug therapy: Secondary | ICD-10-CM | POA: Diagnosis not present

## 2017-11-01 DIAGNOSIS — E291 Testicular hypofunction: Secondary | ICD-10-CM | POA: Diagnosis not present

## 2017-11-01 DIAGNOSIS — N5201 Erectile dysfunction due to arterial insufficiency: Secondary | ICD-10-CM | POA: Diagnosis not present

## 2017-12-13 DIAGNOSIS — E291 Testicular hypofunction: Secondary | ICD-10-CM | POA: Diagnosis not present

## 2017-12-13 DIAGNOSIS — N5201 Erectile dysfunction due to arterial insufficiency: Secondary | ICD-10-CM | POA: Diagnosis not present

## 2017-12-13 DIAGNOSIS — N401 Enlarged prostate with lower urinary tract symptoms: Secondary | ICD-10-CM | POA: Diagnosis not present

## 2017-12-26 ENCOUNTER — Other Ambulatory Visit: Payer: Self-pay | Admitting: Family Medicine

## 2017-12-26 DIAGNOSIS — R062 Wheezing: Secondary | ICD-10-CM | POA: Diagnosis not present

## 2017-12-26 DIAGNOSIS — J069 Acute upper respiratory infection, unspecified: Secondary | ICD-10-CM | POA: Diagnosis not present

## 2017-12-28 NOTE — Telephone Encounter (Signed)
Call pt I refiled celexa and lotensin   Please educate pt that he should only take ibuprofen ( particularly 800mg ) rarely as when taken with celexa can increase his risk for GIB.

## 2017-12-28 NOTE — Telephone Encounter (Signed)
Pt has appt on 01/16/18 to transfer care. Okay to refill?

## 2018-01-03 NOTE — Telephone Encounter (Signed)
Patient advised to take ibuprofen 800 mg rarely due to GIB he verbalized an understanding.

## 2018-01-16 ENCOUNTER — Encounter: Payer: Self-pay | Admitting: Family

## 2018-01-16 ENCOUNTER — Ambulatory Visit: Payer: Federal, State, Local not specified - PPO | Admitting: Family

## 2018-01-16 VITALS — BP 122/74 | HR 76 | Temp 98.7°F | Resp 16 | Ht 75.0 in | Wt 262.8 lb

## 2018-01-16 DIAGNOSIS — F329 Major depressive disorder, single episode, unspecified: Secondary | ICD-10-CM | POA: Diagnosis not present

## 2018-01-16 DIAGNOSIS — I10 Essential (primary) hypertension: Secondary | ICD-10-CM | POA: Diagnosis not present

## 2018-01-16 DIAGNOSIS — F32A Depression, unspecified: Secondary | ICD-10-CM

## 2018-01-16 MED ORDER — CITALOPRAM HYDROBROMIDE 20 MG PO TABS: 40.0000 mg | ORAL_TABLET | Freq: Every day | ORAL | 1 refills | Status: DC

## 2018-01-16 NOTE — Assessment & Plan Note (Signed)
Largely stable. Concerned for winter months. Trial of increased celexa, counseling Close follow up 2 months.

## 2018-01-16 NOTE — Patient Instructions (Addendum)
Referral to counseling  Trial increase of celexa to 40mg  total once per day.   Fasting labs when you can.   Essentials for good sleep:   #1 Exercise #2 Limit Caffeine ( no caffeine after lunch) #3 No smart phones, TV prior to bed -- BLUE light is VERY activating and send the brain an 'awake message.'  #4 Go to bed at same time of night each night and get up at same time of day.  #5 Take 0.5 to 5mg  melatonin at 7pm with dinner -this is when natural melatonin will start to increase #6 May try over the counter Unisom for sleep aid   Close follow up

## 2018-01-16 NOTE — Assessment & Plan Note (Signed)
At goal. No changes made today. 

## 2018-01-16 NOTE — Progress Notes (Signed)
Subjective:    Patient ID: Bernard Blake, male    DOB: [birth year], 47 y.o.   MRN: [id number]  CC: Bernard Blake is a 47 y.o. male who presents today for follow up.   HPI: Depression- on celexa. Has been on couple of years. struggles more in cold months. Happier when outside.  Doing well however still has days when depressed. Did counseling in the past which was helpful. No hospitalizations for depression or mental health. No  suicide attempts. no si/hi. Sleeps 5-6 hours per night. Feels keyed up from work and stays up with daughter. No trouble falling asleep. Looks at phone for a while. Some anxiety about the next day.   Recovering alcohol 10 years ago. Self medicating for depression at that time. Sober since.   HTN- compliant with medication. Doesn't  Check BP at home. Denies exertional chest pain or pressure, numbness or tingling radiating to left arm or jaw, palpitations, dizziness, frequent headaches, changes in vision, or shortness of breath.   Walks 10,000 steps per day. 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   Follows with urology for testosterone autopen replacement. Can see a little improvement with mood on medication.   PSA done by urology.   Due for CPE    HISTORY:  Past Medical History:  Diagnosis Date  . Allergy   . Chicken pox   . Depression   . History of alcohol abuse   . Hypertension   . Low testosterone    Past Surgical History:  Procedure Laterality Date  . ANKLE SURGERY Left    BB removal   Family History  Problem Relation Age of Onset  . Arthritis Mother   . Hyperlipidemia Mother   . Hypertension Mother   . Arthritis Father   . Hyperlipidemia Father   . Hypertension Father   . Diabetes Father     Allergies: Tetanus toxoids Current Outpatient Medications on File Prior to Visit  Medication Sig Dispense Refill  . Ascorbic Acid (VITAMIN C) 1000 MG tablet Take 2,000 mg by mouth daily.    . benazepril (LOTENSIN) 10 MG tablet TAKE 1 TABLET(10 MG) BY MOUTH DAILY 90  tablet 0  . betamethasone valerate ointment (VALISONE) 0.1 % Apply 1 application topically 2 (two) times daily. For 1 week. Do not use more than 2 weeks consecutively. 30 g 0  . cholecalciferol (VITAMIN D) 1000 UNITS tablet Take 2,000 Units by mouth daily.    Marland Kitchen Co-Enzyme Q-10 100 MG CAPS Take 100 mg by mouth.    Marland Kitchen glucosamine-chondroitin 500-400 MG tablet Take 1 tablet by mouth 3 (three) times daily.    Marland Kitchen ibuprofen (ADVIL,MOTRIN) 800 MG tablet Take 1 tablet (800 mg total) by mouth every 8 (eight) hours as needed. 90 tablet 1  . Omega-3 Fatty Acids (FISH OIL) 1000 MG CAPS Take 2,400 mg by mouth once.    . XYOSTED 75 MG/0.5ML SOAJ INJECT 0.5 ML SUBCUTANEOUSLY ONCE EVERY WEEK  5  . Zinc Sulfate (ZINC 15) 66 MG TABS Take 15 mg by mouth.     No current facility-administered medications on file prior to visit.     Social History   Tobacco Use  . Smoking status: Former Smoker    Packs/day: 1.00    Years: 8.00    Pack years: 8.00    Types: Cigarettes  . Smokeless tobacco: Current User    Types: Snuff  . Tobacco comment: Quit smoking in 1995.  Substance Use Topics  . Alcohol use: No    Alcohol/week: 0.0  standard drinks    Comment: Former alcoholic; now sober  . Drug use: No    Review of Systems  Constitutional: Negative for chills and fever.  Respiratory: Negative for cough.   Cardiovascular: Negative for chest pain and palpitations.  Gastrointestinal: Negative for nausea and vomiting.  Psychiatric/Behavioral: Negative for sleep disturbance and suicidal ideas. The patient is nervous/anxious.       Objective:    BP 122/74 (BP Location: Left Arm, Patient Position: Sitting, Cuff Size: Large)   Pulse 76   Temp 98.7 F (37.1 C) (Oral)   Resp 16   Ht 6\' 3"  (1.905 m)   Wt 262 lb 12 oz (119.2 kg)   SpO2 96%   BMI 32.84 kg/m  BP Readings from Last 3 Encounters:  01/16/18 122/74  09/15/16 124/86  04/23/15 122/78   Wt Readings from Last 3 Encounters:  01/16/18 262 lb 12 oz (119.2  kg)  09/15/16 255 lb 6 oz (115.8 kg)  04/23/15 257 lb 2 oz (116.6 kg)    Physical Exam  Constitutional: He appears well-developed and well-nourished.  Cardiovascular: Regular rhythm and normal heart sounds.  Pulmonary/Chest: Effort normal and breath sounds normal. No respiratory distress. He has no wheezes. He has no rhonchi. He has no rales.  Neurological: He is alert.  Skin: Skin is warm and dry.  Psychiatric: He has a normal mood and affect. His speech is normal and behavior is normal.  Vitals reviewed.      Assessment & Plan:   Problem List Items Addressed This Visit      Cardiovascular and Mediastinum   Essential hypertension    At goal. No changes made today.       Relevant Orders   CBC with Differential/Platelet   Hemoglobin A1c   Comprehensive metabolic panel   Lipid panel   TSH   VITAMIN D 25 Hydroxy (Vit-D Deficiency, Fractures)     Other   Depression - Primary    Largely stable. Concerned for winter months. Trial of increased celexa, counseling Close follow up 2 months.       Relevant Medications   citalopram (CELEXA) 20 MG tablet   Other Relevant Orders   Ambulatory referral to Psychology       I have discontinued Bernard Blake's testosterone. I have also changed his citalopram. Additionally, I am having him maintain his Fish Oil, glucosamine-chondroitin, vitamin C, cholecalciferol, Zinc Sulfate, Co-Enzyme Q-10, betamethasone valerate ointment, ibuprofen, benazepril, and XYOSTED.   Meds ordered this encounter  Medications  . citalopram (CELEXA) 20 MG tablet    Sig: Take 2 tablets (40 mg total) by mouth daily.    Dispense:  120 tablet    Refill:  1    Order Specific Question:   Supervising Provider    Answer:   Bernard Blake [[id number]]    Return precautions given.   Risks, benefits, and alternatives of the medications and treatment plan prescribed today were discussed, and patient expressed understanding.   Education regarding symptom  management and diagnosis given to patient on AVS.  Continue to follow with Bernard Hawthorne, FNP for routine health maintenance.   Bernard Blake and I agreed with plan.   Bernard Paris, FNP

## 2018-01-17 ENCOUNTER — Ambulatory Visit (INDEPENDENT_AMBULATORY_CARE_PROVIDER_SITE_OTHER): Payer: Federal, State, Local not specified - PPO

## 2018-01-17 ENCOUNTER — Encounter: Payer: Self-pay | Admitting: Podiatry

## 2018-01-17 ENCOUNTER — Ambulatory Visit: Payer: Federal, State, Local not specified - PPO | Admitting: Podiatry

## 2018-01-17 ENCOUNTER — Other Ambulatory Visit: Payer: Self-pay

## 2018-01-17 VITALS — BP 146/95 | HR 69

## 2018-01-17 DIAGNOSIS — M778 Other enthesopathies, not elsewhere classified: Secondary | ICD-10-CM

## 2018-01-17 DIAGNOSIS — M779 Enthesopathy, unspecified: Secondary | ICD-10-CM | POA: Diagnosis not present

## 2018-01-17 DIAGNOSIS — M722 Plantar fascial fibromatosis: Secondary | ICD-10-CM

## 2018-01-17 MED ORDER — MELOXICAM 15 MG PO TABS: 15.0000 mg | ORAL_TABLET | Freq: Every day | ORAL | 3 refills | Status: DC

## 2018-01-17 MED ORDER — METHYLPREDNISOLONE 4 MG PO TBPK: ORAL_TABLET | ORAL | 0 refills | Status: DC

## 2018-01-17 NOTE — Progress Notes (Signed)
Subjective:  Patient ID: Bernard Blake, male    DOB: 1970-04-07,  MRN: [id number] HPI Chief Complaint  Patient presents with  . 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 male presents with the above complaint.   ROS: Denies fever chills nausea vomiting muscle aches pains calf pain back pain chest pain shortness of breath.  Past Medical History:  Diagnosis Date  . Allergy   . Chicken pox   . Depression   . History of alcohol abuse   . Hypertension   . Low testosterone    Past Surgical History:  Procedure Laterality Date  . ANKLE SURGERY Left    BB removal    Current Outpatient Medications:  .  albuterol (PROVENTIL HFA;VENTOLIN HFA) 108 (90 Base) MCG/ACT inhaler, INL 2 PFS PO Q 4 TO 6 H PRN, Disp: , Rfl: 0 .  amoxicillin-clavulanate (AUGMENTIN) 875-125 MG tablet, TK 1 T PO Q 12 H, Disp: , Rfl: 0 .  Ascorbic Acid (VITAMIN C) 1000 MG tablet, Take 2,000 mg by mouth daily., Disp: , Rfl:  .  benazepril (LOTENSIN) 10 MG tablet, TAKE 1 TABLET(10 MG) BY MOUTH DAILY, Disp: 90 tablet, Rfl: 0 .  betamethasone valerate ointment (VALISONE) 0.1 %, Apply 1 application topically 2 (two) times daily. For 1 week. Do not use more than 2 weeks consecutively., Disp: 30 g, Rfl: 0 .  cholecalciferol (VITAMIN D) 1000 UNITS tablet, Take 2,000 Units by mouth daily., Disp: , Rfl:  .  citalopram (CELEXA) 20 MG tablet, Take 2 tablets (40 mg total) by mouth daily., Disp: 120 tablet, Rfl: 1 .  Co-Enzyme Q-10 100 MG CAPS, Take 100 mg by mouth., Disp: , Rfl:  .  glucosamine-chondroitin 500-400 MG tablet, Take 1 tablet by mouth 3 (three) times daily., Disp: , Rfl:  .  ibuprofen (ADVIL,MOTRIN) 800 MG tablet, Take 1 tablet (800 mg total) by mouth every 8 (eight) hours as needed., Disp: 90 tablet, Rfl: 1 .  meloxicam (MOBIC) 15 MG tablet, Take 1 tablet  (15 mg total) by mouth daily., Disp: 30 tablet, Rfl: 3 .  methylPREDNISolone (MEDROL DOSEPAK) 4 MG TBPK tablet, 6 day dose pack - take as directed, Disp: 21 tablet, Rfl: 0 .  Nutritional Supplements (ESTROVEN WEIGHT MANAGEMENT) CAPS, Take by mouth., Disp: , Rfl:  .  omega-3 acid ethyl esters (LOVAZA) 1 g capsule, Take by mouth., Disp: , Rfl:  .  Omega-3 Fatty Acids (FISH OIL) 1000 MG CAPS, Take 2,400 mg by mouth once., Disp: , Rfl:  .  promethazine-dextromethorphan (PROMETHAZINE-DM) 6.25-15 MG/5ML syrup, TK 5 ML PO Q 4 TO 6 H AS NEEDED. NTE 30 ML IN 24 H, Disp: , Rfl: 0 .  testosterone (ANDRODERM) 4 MG/24HR PT24 patch, Place onto the skin., Disp: , Rfl:  .  XYOSTED 75 MG/0.5ML SOAJ, INJECT 0.5 ML SUBCUTANEOUSLY ONCE EVERY WEEK, Disp: , Rfl: 5 .  Zinc Sulfate (ZINC 15) 66 MG TABS, Take 15 mg by mouth., Disp: , Rfl:   Allergies  Allergen Reactions  . Tetanus Toxoids Swelling   Review of Systems Objective:   Vitals:   01/17/18 0946  BP: (!) 146/95  Pulse: 69    General: Well developed, nourished, in no acute distress, alert and oriented x3   Dermatological: Skin is warm, dry and  supple bilateral. Nails x 10 are well maintained; remaining integument appears unremarkable at this time. There are no open sores, no preulcerative lesions, no rash or signs of infection present.  Vascular: Dorsalis Pedis artery and Posterior Tibial artery pedal pulses are 2/4 bilateral with immedate capillary fill time. Pedal hair growth present. No varicosities and no lower extremity edema present bilateral.   Neruologic: Grossly intact via light touch bilateral. Vibratory intact via tuning fork bilateral. Protective threshold with Semmes Wienstein monofilament intact to all pedal sites bilateral. Patellar and Achilles deep tendon reflexes 2+ bilateral. No Babinski or clonus noted bilateral.   Musculoskeletal: No gross boney pedal deformities bilateral. No pain, crepitus, or limitation noted with foot and ankle  range of motion bilateral. Muscular strength 5/5 in all groups tested bilateral.  Rectus foot type.  Pain on palpation medial calcaneal tubercle bilateral.  Mild tenderness on palpation of the Achilles tendon bilateral.  Gait: Unassisted, Nonantalgic.    Radiographs:  Radiographs taken today demonstrate soft tissue increase in density plantar fashion calcaneal insertion site bilateral.  Assessment & Plan:   Assessment: Plantar fasciitis chronic in nature bilateral.  Plan: Discussed etiology pathology conservative versus surgical therapies.  At this point started him on a Medrol Dosepak to be followed by meloxicam.  Injected the bilateral heels after sterile Betadine skin prep 20 mg Kenalog 5 mg Marcaine point maximal tenderness bilateral.  Tolerated procedure well.  Also placed in plantar fascial braces bilateral and single night splint.  He was also directed to [facility] for orthotics.     Max T. Juntura, [place]

## 2018-01-24 ENCOUNTER — Other Ambulatory Visit (INDEPENDENT_AMBULATORY_CARE_PROVIDER_SITE_OTHER): Payer: Federal, State, Local not specified - PPO

## 2018-01-24 ENCOUNTER — Other Ambulatory Visit: Payer: Self-pay | Admitting: Family

## 2018-01-24 DIAGNOSIS — I10 Essential (primary) hypertension: Secondary | ICD-10-CM | POA: Diagnosis not present

## 2018-01-24 LAB — CBC WITH DIFFERENTIAL/PLATELET
Basophils Absolute: 0.1 10*3/uL (ref 0.0–0.1)
Basophils Relative: 1 % (ref 0.0–3.0)
EOS PCT: 2.6 % (ref 0.0–5.0)
Eosinophils Absolute: 0.2 10*3/uL (ref 0.0–0.7)
HEMATOCRIT: 49.8 % (ref 39.0–52.0)
HEMOGLOBIN: 16.8 g/dL (ref 13.0–17.0)
Lymphocytes Relative: 23.8 % (ref 12.0–46.0)
Lymphs Abs: 2.1 10*3/uL (ref 0.7–4.0)
MCHC: 33.7 g/dL (ref 30.0–36.0)
MCV: 86.6 fl (ref 78.0–100.0)
MONOS PCT: 10.8 % (ref 3.0–12.0)
Monocytes Absolute: 0.9 10*3/uL (ref 0.1–1.0)
Neutro Abs: 5.3 10*3/uL (ref 1.4–7.7)
Neutrophils Relative %: 61.8 % (ref 43.0–77.0)
Platelets: 277 10*3/uL (ref 150.0–400.0)
RBC: 5.74 Mil/uL (ref 4.22–5.81)
RDW: 14.3 % (ref 11.5–15.5)
WBC: 8.6 10*3/uL (ref 4.0–10.5)

## 2018-01-24 LAB — COMPREHENSIVE METABOLIC PANEL
ALBUMIN: 4.8 g/dL (ref 3.5–5.2)
ALT: 26 U/L (ref 0–53)
AST: 20 U/L (ref 0–37)
Alkaline Phosphatase: 71 U/L (ref 39–117)
BUN: 23 mg/dL (ref 6–23)
CALCIUM: 9.5 mg/dL (ref 8.4–10.5)
CHLORIDE: 98 meq/L (ref 96–112)
CO2: 33 mEq/L — ABNORMAL HIGH (ref 19–32)
Creatinine, Ser: 1.03 mg/dL (ref 0.40–1.50)
GFR: 81.49 mL/min (ref >60.00)
Glucose, Bld: 96 mg/dL (ref 70–99)
POTASSIUM: 3.9 meq/L (ref 3.5–5.1)
SODIUM: 137 meq/L (ref 135–145)
Total Bilirubin: 0.5 mg/dL (ref 0.2–1.2)
Total Protein: 7.2 g/dL (ref 6.0–8.3)

## 2018-01-24 LAB — LIPID PANEL
CHOLESTEROL: 143 mg/dL (ref 0–200)
HDL: 44.7 mg/dL (ref >39.00)
LDL CALC: 81 mg/dL (ref 0–99)
NonHDL: 98.24
Total CHOL/HDL Ratio: 3
Triglycerides: 88 mg/dL (ref 0.0–149.0)
VLDL: 17.6 mg/dL (ref 0.0–40.0)

## 2018-01-24 LAB — VITAMIN D 25 HYDROXY (VIT D DEFICIENCY, FRACTURES): VITD: 43.99 ng/mL (ref 30.00–100.00)

## 2018-01-24 LAB — HEMOGLOBIN A1C: HEMOGLOBIN A1C: 5.8 % (ref 4.6–6.5)

## 2018-01-24 LAB — TSH: TSH: 2 u[IU]/mL (ref 0.35–4.50)

## 2018-01-24 NOTE — Telephone Encounter (Signed)
  Left voicemail to call ok for [facility] to speak with patient . Spoke with pharmacy they at first ran script as cash they will re-run script for citalopram 20mg  2 tablets daily and should have it ready tomorrow for pick up.

## 2018-01-24 NOTE — Telephone Encounter (Signed)
Pt will run out before next visit on 03/20/2018 for medication citalopram (CELEXA) 20 MG tablet. Pt has been doubling up on the medication. Please advise? And Thank you!  Pharmacy is [id number] - [address], [place]  Call pt @ [phone].

## 2018-01-25 NOTE — Telephone Encounter (Signed)
rx sent 10/28

## 2018-01-25 NOTE — Telephone Encounter (Signed)
Spoke with patient advised that pharmacy will be running script for citalopram 20mg  2 a day , he states he will check back with the pharmacy.

## 2018-02-08 ENCOUNTER — Ambulatory Visit: Payer: Federal, State, Local not specified - PPO | Admitting: Orthotics

## 2018-02-08 DIAGNOSIS — M722 Plantar fascial fibromatosis: Secondary | ICD-10-CM

## 2018-02-08 NOTE — Progress Notes (Signed)
Patient came in today to pick up custom made foot orthotics.  The goals were accomplished and the patient reported no dissatisfaction with said orthotics.  Patient was advised of breakin period and how to report any issues. 

## 2018-02-27 ENCOUNTER — Ambulatory Visit: Payer: Federal, State, Local not specified - PPO | Admitting: Psychology

## 2018-03-01 ENCOUNTER — Ambulatory Visit: Payer: Federal, State, Local not specified - PPO | Admitting: Podiatry

## 2018-03-06 ENCOUNTER — Ambulatory Visit: Payer: Federal, State, Local not specified - PPO | Admitting: Psychology

## 2018-03-20 ENCOUNTER — Ambulatory Visit: Payer: Federal, State, Local not specified - PPO | Admitting: Family

## 2018-03-29 ENCOUNTER — Other Ambulatory Visit: Payer: Self-pay | Admitting: Family

## 2018-04-17 ENCOUNTER — Ambulatory Visit: Payer: Federal, State, Local not specified - PPO | Admitting: Family

## 2018-04-26 ENCOUNTER — Ambulatory Visit: Payer: Federal, State, Local not specified - PPO | Admitting: Family

## 2018-04-26 VITALS — BP 130/84 | HR 67 | Temp 97.7°F | Resp 18 | Ht 75.0 in | Wt 263.0 lb

## 2018-04-26 DIAGNOSIS — F329 Major depressive disorder, single episode, unspecified: Secondary | ICD-10-CM | POA: Diagnosis not present

## 2018-04-26 DIAGNOSIS — J0100 Acute maxillary sinusitis, unspecified: Secondary | ICD-10-CM | POA: Diagnosis not present

## 2018-04-26 DIAGNOSIS — F32A Depression, unspecified: Secondary | ICD-10-CM

## 2018-04-26 MED ORDER — AMOXICILLIN-POT CLAVULANATE 875-125 MG PO TABS: 1.0000 | ORAL_TABLET | Freq: Two times a day (BID) | ORAL | 0 refills | Status: DC

## 2018-04-26 MED ORDER — BUPROPION HCL ER (XL) 150 MG PO TB24: ORAL_TABLET | ORAL | 3 refills | Status: DC

## 2018-04-26 NOTE — Patient Instructions (Addendum)
Stay on celexa  May start wellbutrin  Start augmentin  Ensure to take probiotics while on antibiotics and also for 2 weeks after completion. It is important to re-colonize the gut with good bacteria and also to prevent any diarrheal infections associated with antibiotic use.    Follow up 2 months  Today we discussed referrals, orders. Cardiology   I have placed these orders in the system for you.  Please be sure to give [place] a call if you have not heard from our office regarding this. We should hear from [place] within ONE week with information regarding your appointment. If not, please let me know immediately.

## 2018-04-26 NOTE — Assessment & Plan Note (Signed)
Based on duration of symptoms, will start antibiotic. Patient will let me know if not better.

## 2018-04-26 NOTE — Progress Notes (Signed)
Subjective:    Patient ID: Bernard Blake, male    DOB: 1970-11-02, 48 y.o.   MRN: [id number]  CC: Bernard Blake is a 48 y.o. male who presents today for follow up.   HPI: Depression- celexa 40mg  with improvement. Days of depression decreased from 3-4 to 1-2 per week. Some fatigue, decreased concentration.  No si/hi  No alcohol use. Recovering alcholic for 11 years. No h/o seizure.   Works out 3 days per week  Saw dr fath years ago and had stress test, thinks was normal. No CP, SOB.   Congestion for 2-3 weeks, some better. Facial pressure.  Some wheezing which resolves with albterol inhaler. Has been on sudafed.  No fever, vision changes, ear pain, ha    HISTORY:  Past Medical History:  Diagnosis Date  . Allergy   . Chicken pox   . Depression   . History of alcohol abuse   . Hypertension   . Low testosterone    Past Surgical History:  Procedure Laterality Date  . ANKLE SURGERY Left    BB removal   Family History  Problem Relation Age of Onset  . Arthritis Mother   . Hyperlipidemia Mother   . Hypertension Mother   . Arthritis Father   . Hyperlipidemia Father   . Hypertension Father   . Diabetes Father     Allergies: Tetanus toxoids Current Outpatient Medications on File Prior to Visit  Medication Sig Dispense Refill  . albuterol (PROVENTIL HFA;VENTOLIN HFA) 108 (90 Base) MCG/ACT inhaler INL 2 PFS PO Q 4 TO 6 H PRN  0  . Ascorbic Acid (VITAMIN C) 1000 MG tablet Take 2,000 mg by mouth daily.    . benazepril (LOTENSIN) 10 MG tablet TAKE 1 TABLET(10 MG) BY MOUTH DAILY 90 tablet 0  . betamethasone valerate ointment (VALISONE) 0.1 % Apply 1 application topically 2 (two) times daily. For 1 week. Do not use more than 2 weeks consecutively. 30 g 0  . cholecalciferol (VITAMIN D) 1000 UNITS tablet Take 2,000 Units by mouth daily.    . citalopram (CELEXA) 20 MG tablet Take 2 tablets (40 mg total) by mouth daily. 120 tablet 1  . Co-Enzyme Q-10 100 MG CAPS Take 100 mg by  mouth.    . meloxicam (MOBIC) 15 MG tablet Take 1 tablet (15 mg total) by mouth daily. 30 tablet 3  . Omega-3 Fatty Acids (FISH OIL) 1000 MG CAPS Take 2,400 mg by mouth once.    . XYOSTED 75 MG/0.5ML SOAJ INJECT 0.5 ML SUBCUTANEOUSLY ONCE EVERY WEEK  5  . Zinc Sulfate (ZINC 15) 66 MG TABS Take 15 mg by mouth.    Marland Kitchen ibuprofen (ADVIL,MOTRIN) 800 MG tablet Take 1 tablet (800 mg total) by mouth every 8 (eight) hours as needed. (Patient not taking: Reported on 04/26/2018) 90 tablet 1   No current facility-administered medications on file prior to visit.     Social History   Tobacco Use  . Smoking status: Former Smoker    Packs/day: 1.00    Years: 8.00    Pack years: 8.00    Types: Cigarettes  . Smokeless tobacco: Current User    Types: Snuff  . Tobacco comment: Quit smoking in 1995.  Substance Use Topics  . Alcohol use: No    Alcohol/week: 0.0 standard drinks    Comment: Former alcoholic; now sober  . Drug use: No    Review of Systems  Constitutional: Negative for chills and fever.  HENT: Positive for congestion and  sinus pressure. Negative for ear pain and sore throat.   Respiratory: Positive for wheezing. Negative for cough and shortness of breath.   Cardiovascular: Negative for chest pain and palpitations.  Gastrointestinal: Negative for nausea and vomiting.      Objective:    BP 130/84   Pulse 67   Temp 97.7 F (36.5 C) (Oral)   Resp 18   Ht 6\' 3"  (1.905 m)   Wt 263 lb (119.3 kg)   SpO2 96%   BMI 32.87 kg/m  BP Readings from Last 3 Encounters:  04/26/18 130/84  01/17/18 (!) 146/95  01/16/18 122/74   Wt Readings from Last 3 Encounters:  04/26/18 263 lb (119.3 kg)  01/16/18 262 lb 12 oz (119.2 kg)  09/15/16 255 lb 6 oz (115.8 kg)    Physical Exam Vitals signs reviewed.  Constitutional:      Appearance: He is well-developed.  HENT:     Head: Normocephalic and atraumatic.     Right Ear: Hearing, tympanic membrane, ear canal and external ear normal. No  decreased hearing noted. No drainage, swelling or tenderness. No middle ear effusion. Tympanic membrane is not injected, erythematous or bulging.     Left Ear: Hearing, tympanic membrane, ear canal and external ear normal. No decreased hearing noted. No drainage, swelling or tenderness.  No middle ear effusion. Tympanic membrane is not injected, erythematous or bulging.     Nose: Nose normal.     Right Sinus: No maxillary sinus tenderness or frontal sinus tenderness.     Left Sinus: No maxillary sinus tenderness or frontal sinus tenderness.     Mouth/Throat:     Pharynx: Uvula midline. No oropharyngeal exudate or posterior oropharyngeal erythema.     Tonsils: No tonsillar abscesses.  Eyes:     Conjunctiva/sclera: Conjunctivae normal.  Cardiovascular:     Rate and Rhythm: Regular rhythm.     Heart sounds: Normal heart sounds.  Pulmonary:     Effort: Pulmonary effort is normal. No respiratory distress.     Breath sounds: Normal breath sounds. No wheezing, rhonchi or rales.  Lymphadenopathy:     Head:     Right side of head: No submental, submandibular, tonsillar, preauricular, posterior auricular or occipital adenopathy.     Left side of head: No submental, submandibular, tonsillar, preauricular, posterior auricular or occipital adenopathy.     Cervical: No cervical adenopathy.  Skin:    General: Skin is warm and dry.  Neurological:     Mental Status: He is alert.  Psychiatric:        Speech: Speech normal.        Behavior: Behavior normal.        Assessment & Plan:   Problem List Items Addressed This Visit      Respiratory   Acute non-recurrent maxillary sinusitis    Based on duration of symptoms, will start antibiotic. Patient will let me know if not better.       Relevant Medications   amoxicillin-clavulanate (AUGMENTIN) 875-125 MG tablet     Other   Depression - Primary    Improved with Celexa, will adjunct Wellbutrin.  EKG shows no QT prolongation.  No ischemia noted.   No prior EKG to compare to. Close follow up.  Note: referral to cardiology for risk stratification      Relevant Medications   buPROPion (WELLBUTRIN XL) 150 MG 24 hr tablet   Other Relevant Orders   EKG 12-Lead (Completed)   Ambulatory referral to Cardiology  I have discontinued Jamont Tapscott's glucosamine-chondroitin, amoxicillin-clavulanate, ESTROVEN WEIGHT MANAGEMENT, omega-3 acid ethyl esters, promethazine-dextromethorphan, testosterone, and methylPREDNISolone. I am also having him start on buPROPion and amoxicillin-clavulanate. Additionally, I am having him maintain his Fish Oil, vitamin C, cholecalciferol, Zinc Sulfate, Co-Enzyme Q-10, betamethasone valerate ointment, ibuprofen, XYOSTED, citalopram, albuterol, meloxicam, and benazepril.   Meds ordered this encounter  Medications  . buPROPion (WELLBUTRIN XL) 150 MG 24 hr tablet    Sig: Start 150 mg ER PO qam, increase after 3 days to 300 mg qam.    Dispense:  60 tablet    Refill:  3    Order Specific Question:   Supervising Provider    Answer:   Deborra Medina L [[id number]]  . amoxicillin-clavulanate (AUGMENTIN) 875-125 MG tablet    Sig: Take 1 tablet by mouth 2 (two) times daily.    Dispense:  14 tablet    Refill:  0    Order Specific Question:   Supervising Provider    Answer:   Crecencio Mc [[id number]]    Return precautions given.   Risks, benefits, and alternatives of the medications and treatment plan prescribed today were discussed, and patient expressed understanding.   Education regarding symptom management and diagnosis given to patient on AVS.  Continue to follow with [name], FNP for routine health maintenance.   Bernard Blake and I agreed with plan.   [name], FNP

## 2018-04-26 NOTE — Assessment & Plan Note (Addendum)
Improved with Celexa, will adjunct Wellbutrin.  EKG shows no QT prolongation.  No ischemia noted.  No prior EKG to compare to. Close follow up.  Note: referral to cardiology for risk stratification

## 2018-05-03 ENCOUNTER — Encounter: Payer: Self-pay | Admitting: Podiatry

## 2018-05-03 ENCOUNTER — Ambulatory Visit: Payer: Federal, State, Local not specified - PPO | Admitting: Podiatry

## 2018-05-03 DIAGNOSIS — M722 Plantar fascial fibromatosis: Secondary | ICD-10-CM | POA: Diagnosis not present

## 2018-05-03 NOTE — Progress Notes (Signed)
He presents today for follow-up of plantar fasciitis states that is doing a lot better.  Objective: Vital signs are stable alert and oriented x3.  Pulses are palpable.  There is no erythema edema cellulitis drainage or odor no pain on palpation.  Assessment: Well-healing plantar fasciitis.  Plan: Continue current therapies and follow-up with me as needed.

## 2018-06-15 DIAGNOSIS — N5201 Erectile dysfunction due to arterial insufficiency: Secondary | ICD-10-CM | POA: Diagnosis not present

## 2018-06-15 DIAGNOSIS — N401 Enlarged prostate with lower urinary tract symptoms: Secondary | ICD-10-CM | POA: Diagnosis not present

## 2018-06-15 DIAGNOSIS — E291 Testicular hypofunction: Secondary | ICD-10-CM | POA: Diagnosis not present

## 2018-06-17 ENCOUNTER — Other Ambulatory Visit: Payer: Self-pay | Admitting: Family

## 2018-07-03 ENCOUNTER — Ambulatory Visit (INDEPENDENT_AMBULATORY_CARE_PROVIDER_SITE_OTHER): Payer: Federal, State, Local not specified - PPO | Admitting: Family

## 2018-07-03 ENCOUNTER — Encounter: Payer: Self-pay | Admitting: Family

## 2018-07-03 ENCOUNTER — Other Ambulatory Visit: Payer: Self-pay

## 2018-07-03 DIAGNOSIS — M722 Plantar fascial fibromatosis: Secondary | ICD-10-CM | POA: Insufficient documentation

## 2018-07-03 DIAGNOSIS — F329 Major depressive disorder, single episode, unspecified: Secondary | ICD-10-CM

## 2018-07-03 DIAGNOSIS — I10 Essential (primary) hypertension: Secondary | ICD-10-CM | POA: Diagnosis not present

## 2018-07-03 DIAGNOSIS — F32A Depression, unspecified: Secondary | ICD-10-CM

## 2018-07-03 MED ORDER — BENAZEPRIL HCL 10 MG PO TABS: ORAL_TABLET | ORAL | 2 refills | Status: DC

## 2018-07-03 MED ORDER — MELOXICAM 7.5 MG PO TABS: 7.5000 mg | ORAL_TABLET | Freq: Every day | ORAL | 1 refills | Status: DC | PRN

## 2018-07-03 MED ORDER — CITALOPRAM HYDROBROMIDE 20 MG PO TABS: 40.0000 mg | ORAL_TABLET | Freq: Every day | ORAL | 1 refills | Status: DC

## 2018-07-03 MED ORDER — BUPROPION HCL ER (XL) 300 MG PO TB24: 300.0000 mg | ORAL_TABLET | Freq: Every day | ORAL | 1 refills | Status: DC

## 2018-07-03 NOTE — Assessment & Plan Note (Signed)
Advised of risk of meloxicam, including GI bleed, ulcer, renal insufficiency.  Advised patient to use lower dose, and use as needed versus daily.  Advised use of behavior modifications prior to using NSAID.  Patient verbalized understanding of all.  Will follow

## 2018-07-03 NOTE — Patient Instructions (Addendum)
Monitor blood pressure,  Goal is less than 120/80, based on newest guidelines; if persistently higher, please make sooner follow up appointment so we can recheck you blood pressure and manage medications.  We have decreased Mobic to 7.5mg . I would like you NOT to take every day as we discussed.   Please advise to  takes Mobic ( meloxicam) with FOOD since it is an anti-inflammatory Must also take with prilosec to protect the stomach as Mobic can cause a GI bleed or ulcer. Do no take over the counter aleve, motrin, advil etc as this is in the same class as Mobic.   Just so you are aware-  If we were to see any decline in kidney function in the future, we would have to discontinue this medication.   Nice to speak with you!

## 2018-07-03 NOTE — Assessment & Plan Note (Addendum)
Doing well on medication. He will monitor and call with readings , especially as last blood pressure in the office was minimally elevated.  He will work on weight loss.  Also discussed NSAIDs and relationship to blood pressure.  He will monitor for this.

## 2018-07-03 NOTE — Assessment & Plan Note (Signed)
Some improvement on new regimen, will continue for now.  Awaiting appointment with counseling.  Patient will let me know how he is doing.

## 2018-07-03 NOTE — Progress Notes (Signed)
.This visit type was conducted due to national recommendations for restrictions regarding the COVID-19 pandemic (e.g. social distancing).  This format is felt to be most appropriate for this patient at this time.  All issues noted in this document were discussed and addressed.  No physical exam was performed (except for noted visual exam findings with Video Visits). Virtual Visit via Video Note  I connected with@  on 07/03/18 at 10:00 AM EDT by a video enabled telemedicine application and verified that I am speaking with the correct person using two identifiers.  Location patient: home Location provider:work  Persons participating in the virtual visit: patient, provider  I discussed the limitations of evaluation and management by telemedicine and the availability of in person appointments. The patient expressed understanding and agreed to proceed.  Interactive audio and video telecommunications were attempted between this provider and patient, however failed, due to patient having technical difficulties or patient did not have access to video capability. We were able to see each other at first, however audio did not work, therefore limited physical exam per below. we continued and completed visit with audio only.    HPI:  Feels well today, no complaints.   HTN- compliant with medication. Not checking at home. Denies exertional chest pain or pressure, numbness or tingling radiating to left arm or jaw, palpitations, dizziness, frequent headaches, changes in vision, or shortness of breath.   Depression-celexa; started Wellbutrin ; some difference with wellbutrin with mood and concentration, 'not huge difference.' Feels better now that able to get outside. Still has days where struggles with stress at work, finances. No anxiety. Sleeping well. Has appt with counselor, Alene Mires. No si/hi.   No alcohol use.  Cardiology scheduled for May.  Following with Urology, Dr Eliberto Ivory,  for Berdine Addison,     On mobic  daily for plantar fasciitis; started by Dr Milinda Pointer. Wearing inserts. Would like refill due to increased activity in warmer weather  ROS: See pertinent positives and negatives per HPI.  Past Medical History:  Diagnosis Date  . Allergy   . Chicken pox   . Depression   . History of alcohol abuse   . Hypertension   . Low testosterone     Past Surgical History:  Procedure Laterality Date  . ANKLE SURGERY Left    BB removal    Family History  Problem Relation Age of Onset  . Arthritis Mother   . Hyperlipidemia Mother   . Hypertension Mother   . Arthritis Father   . Hyperlipidemia Father   . Hypertension Father   . Diabetes Father     SOCIAL HX: former smoker   Current Outpatient Medications:  .  albuterol (PROVENTIL HFA;VENTOLIN HFA) 108 (90 Base) MCG/ACT inhaler, INL 2 PFS PO Q 4 TO 6 H PRN, Disp: , Rfl: 0 .  Ascorbic Acid (VITAMIN C) 1000 MG tablet, Take 2,000 mg by mouth daily., Disp: , Rfl:  .  benazepril (LOTENSIN) 10 MG tablet, TAKE 1 TABLET(10 MG) BY MOUTH DAILY, Disp: 90 tablet, Rfl: 2 .  betamethasone valerate ointment (VALISONE) 0.1 %, Apply 1 application topically 2 (two) times daily. For 1 week. Do not use more than 2 weeks consecutively., Disp: 30 g, Rfl: 0 .  buPROPion (WELLBUTRIN XL) 300 MG 24 hr tablet, Take 1 tablet (300 mg total) by mouth daily., Disp: 90 tablet, Rfl: 1 .  cholecalciferol (VITAMIN D) 1000 UNITS tablet, Take 2,000 Units by mouth daily., Disp: , Rfl:  .  citalopram (CELEXA) 20 MG tablet, Take  2 tablets (40 mg total) by mouth daily., Disp: 120 tablet, Rfl: 1 .  Co-Enzyme Q-10 100 MG CAPS, Take 100 mg by mouth., Disp: , Rfl:  .  meloxicam (MOBIC) 7.5 MG tablet, Take 1 tablet (7.5 mg total) by mouth daily as needed for pain., Disp: 90 tablet, Rfl: 1 .  Omega-3 Fatty Acids (FISH OIL) 1000 MG CAPS, Take 2,400 mg by mouth once., Disp: , Rfl:  .  XYOSTED 75 MG/0.5ML SOAJ, INJECT 0.5 ML SUBCUTANEOUSLY ONCE EVERY WEEK, Disp: , Rfl: 5 .  Zinc Sulfate (ZINC  15) 66 MG TABS, Take 15 mg by mouth., Disp: , Rfl:   EXAM:  VITALS per patient if applicable:  GENERAL: alert, oriented, appears well and in no acute distress  PSYCH/NEURO: pleasant and cooperative, no obvious depression or anxiety, speech and thought processing grossly intact  ASSESSMENT AND PLAN:  Discussed the following assessment and plan:  Depression, unspecified depression type - Plan: buPROPion (WELLBUTRIN XL) 300 MG 24 hr tablet, citalopram (CELEXA) 20 MG tablet  Essential hypertension - Plan: benazepril (LOTENSIN) 10 MG tablet  Plantar fasciitis - Plan: meloxicam (MOBIC) 7.5 MG tablet  Problem List Items Addressed This Visit      Cardiovascular and Mediastinum   Essential hypertension    Doing well on medication. He will monitor and call with readings , especially as last blood pressure in the office was minimally elevated.  He will work on weight loss.  Also discussed NSAIDs and relationship to blood pressure.  He will monitor for this.      Relevant Medications   benazepril (LOTENSIN) 10 MG tablet     Musculoskeletal and Integument   Plantar fasciitis    Advised of risk of meloxicam, including GI bleed, ulcer, renal insufficiency.  Advised patient to use lower dose, and use as needed versus daily.  Advised use of behavior modifications prior to using NSAID.  Patient verbalized understanding of all.  Will follow      Relevant Medications   meloxicam (MOBIC) 7.5 MG tablet     Other   Depression - Primary    Some improvement on new regimen, will continue for now.  Awaiting appointment with counseling.  Patient will let me know how he is doing.      Relevant Medications   buPROPion (WELLBUTRIN XL) 300 MG 24 hr tablet   citalopram (CELEXA) 20 MG tablet        I discussed the assessment and treatment plan with the patient. The patient was provided an opportunity to ask questions and all were answered. The patient agreed with the plan and demonstrated an  understanding of the instructions.   The patient was advised to call back or seek an in-person evaluation if the symptoms worsen or if the condition fails to improve as anticipated.   [name], FNP   I spent 25 min non face to face w/ pt.

## 2018-07-19 DIAGNOSIS — E291 Testicular hypofunction: Secondary | ICD-10-CM | POA: Diagnosis not present

## 2018-07-19 DIAGNOSIS — N5201 Erectile dysfunction due to arterial insufficiency: Secondary | ICD-10-CM | POA: Diagnosis not present

## 2018-07-19 DIAGNOSIS — Z79899 Other long term (current) drug therapy: Secondary | ICD-10-CM | POA: Diagnosis not present

## 2018-07-28 ENCOUNTER — Telehealth: Payer: Self-pay

## 2018-07-28 NOTE — Telephone Encounter (Signed)

## 2018-08-01 ENCOUNTER — Telehealth (INDEPENDENT_AMBULATORY_CARE_PROVIDER_SITE_OTHER): Payer: Federal, State, Local not specified - PPO | Admitting: Cardiovascular Disease

## 2018-08-01 ENCOUNTER — Encounter: Payer: Self-pay | Admitting: Cardiovascular Disease

## 2018-08-01 ENCOUNTER — Other Ambulatory Visit: Payer: Self-pay

## 2018-08-01 VITALS — HR 57 | Ht 74.5 in | Wt 256.2 lb

## 2018-08-01 DIAGNOSIS — I10 Essential (primary) hypertension: Secondary | ICD-10-CM

## 2018-08-01 NOTE — Progress Notes (Signed)
Virtual Visit via Video Note   This visit type was conducted due to national recommendations for restrictions regarding the COVID-19 Pandemic (e.g. social distancing) in an effort to limit this patient's exposure and mitigate transmission in our community.  Due to his co-morbid illnesses, this patient is at least at moderate risk for complications without adequate follow up.  This format is felt to be most appropriate for this patient at this time.  All issues noted in this document were discussed and addressed.  A limited physical exam was performed with this format.  Please refer to the patient's chart for his consent to telehealth for [facility].   Date:  08/01/2018   ID:  Bernard Blake, DOB 16-Jan-1971, MRN [id number]  Patient Location: Home Provider Location: Office  PCP:  [name], FNP  Cardiologist:  No primary care provider on file.  Electrophysiologist:  None   Evaluation Performed:  New Patient Evaluation  Chief Complaint: Abnormality on the aorta  History of Present Illness:    Bernard Blake is a 48 y.o. male who was referred by [name] for previous abnormality in the aorta.  The patient has known history of hypertension but overall has been healthy.  He is not a smoker.  He works at the [employer]. He reports that about 20 years ago he was found to have an abnormal chest x-ray which prompted a CT scan and was told about a "pinhole" in the aorta which did not require treatment.  He was referred to Dr. Ubaldo Glassing at that time and no further work-up was needed.  He had subsequent imaging that did not show it according to him.  Unfortunately, these are old records and I could not find any of these .  There is nothing on his record to address this. He denies chest pain or shortness of breath.  No palpitations.  The patient does not have symptoms concerning for COVID-19 infection (fever, chills, cough, or new shortness of breath).    Past Medical History:   Diagnosis Date  . Allergy   . Chicken pox   . Depression   . History of alcohol abuse   . Hypertension   . Low testosterone    Past Surgical History:  Procedure Laterality Date  . ANKLE SURGERY Left    BB removal     Current Meds  Medication Sig  . albuterol (PROVENTIL HFA;VENTOLIN HFA) 108 (90 Base) MCG/ACT inhaler INL 2 PFS PO Q 4 TO 6 H PRN  . Ascorbic Acid (VITAMIN C) 1000 MG tablet Take 2,000 mg by mouth daily.  . benazepril (LOTENSIN) 10 MG tablet TAKE 1 TABLET(10 MG) BY MOUTH DAILY  . buPROPion (WELLBUTRIN XL) 300 MG 24 hr tablet Take 1 tablet (300 mg total) by mouth daily.  . cholecalciferol (VITAMIN D) 1000 UNITS tablet Take 2,000 Units by mouth daily.  . citalopram (CELEXA) 20 MG tablet Take 2 tablets (40 mg total) by mouth daily.  Marland Kitchen Co-Enzyme Q-10 100 MG CAPS Take 100 mg by mouth.  . meloxicam (MOBIC) 7.5 MG tablet Take 1 tablet (7.5 mg total) by mouth daily as needed for pain.  . Omega-3 Fatty Acids (FISH OIL) 1000 MG CAPS Take 2,400 mg by mouth once.  . XYOSTED 75 MG/0.5ML SOAJ INJECT 0.5 ML SUBCUTANEOUSLY ONCE EVERY WEEK  . Zinc Sulfate (ZINC 15) 66 MG TABS Take 15 mg by mouth.     Allergies:   Tetanus toxoids   Social History   Tobacco Use  .  Smoking status: Former Smoker    Packs/day: 1.00    Years: 8.00    Pack years: 8.00    Types: Cigarettes  . Smokeless tobacco: Current User    Types: Snuff  . Tobacco comment: Quit smoking in 1995.  Substance Use Topics  . Alcohol use: No    Alcohol/week: 0.0 standard drinks    Comment: Former alcoholic; now sober  . Drug use: No     Family Hx: The patient's family history includes Arthritis in his father and mother; Diabetes in his father; Hyperlipidemia in his father and mother; Hypertension in his father and mother.  ROS:   Please see the history of present illness.     All other systems reviewed and are negative.   Prior CV studies:   The following studies were reviewed today:    Labs/Other Tests  and Data Reviewed:    EKG:  An ECG dated February 2020 was personally reviewed today and demonstrated:  Normal sinus rhythm with no significant ST changes.  Recent Labs: 01/24/2018: ALT 26; BUN 23; Creatinine, Ser 1.03; Hemoglobin 16.8; Platelets 277.0; Potassium 3.9; Sodium 137; TSH 2.00   Recent Lipid Panel Lab Results  Component Value Date/Time   CHOL 143 01/24/2018 08:59 AM   TRIG 88.0 01/24/2018 08:59 AM   HDL 44.70 01/24/2018 08:59 AM   CHOLHDL 3 01/24/2018 08:59 AM   LDLCALC 81 01/24/2018 08:59 AM    Wt Readings from Last 3 Encounters:  08/01/18 256 lb 4 oz (116.2 kg)  04/26/18 263 lb (119.3 kg)  01/16/18 262 lb 12 oz (119.2 kg)     Objective:    Vital Signs:  Pulse (!) 57   Ht 6' 2.5" (1.892 m)   Wt 256 lb 4 oz (116.2 kg)   BMI 32.46 kg/m    VITAL SIGNS:  reviewed GEN:  no acute distress EYES:  sclerae anicteric, EOMI - Extraocular Movements Intact RESPIRATORY:  normal respiratory effort, symmetric expansion CARDIOVASCULAR:  no peripheral edema SKIN:  no rash, lesions or ulcers. MUSCULOSKELETAL:  no obvious deformities. NEURO:  alert and oriented x 3, no obvious focal deficit PSYCH:  normal affect  ASSESSMENT & PLAN:    1. Nonspecific abnormality noted in the aorta: Unfortunately, no records of this is available.  He was never told about an aneurysm.  From his description, subsequent imaging did not show significant abnormality.  Given that this was a long time ago and currently with no symptoms or nothing to suggest abnormality, I do not think further work-up is needed unless we have more details. 2. Essential hypertension: Currently on benazepril with reasonable control.  COVID-19 Education: The signs and symptoms of COVID-19 were discussed with the patient and how to seek care for testing (follow up with PCP or arrange E-visit).  The importance of social distancing was discussed today.  Time:   Today, I have spent 25 minutes with the patient with telehealth  technology discussing the above problems.     Medication Adjustments/Labs and Tests Ordered: Current medicines are reviewed at length with the patient today.  Concerns regarding medicines are outlined above.   Tests Ordered: No orders of the defined types were placed in this encounter.   Medication Changes: No orders of the defined types were placed in this encounter.   Disposition:  Follow up prn  Signed, Kathlyn Sacramento, MD  08/01/2018 2:29 PM    [place]

## 2018-08-01 NOTE — Patient Instructions (Signed)
Medication Instructions:  No changes If you need a refill on your cardiac medications before your next appointment, please call your pharmacy.   Lab work: None If you have labs (blood work) drawn today and your tests are completely normal, you will receive your results only by: Marland Kitchen MyChart Message (if you have MyChart) OR . A paper copy in the mail If you have any lab test that is abnormal or we need to change your treatment, we will call you to review the results.  Testing/Procedures: None  Follow-Up: As needed

## 2018-08-15 DIAGNOSIS — M9905 Segmental and somatic dysfunction of pelvic region: Secondary | ICD-10-CM | POA: Diagnosis not present

## 2018-08-15 DIAGNOSIS — M62830 Muscle spasm of back: Secondary | ICD-10-CM | POA: Diagnosis not present

## 2018-08-15 DIAGNOSIS — M5136 Other intervertebral disc degeneration, lumbar region: Secondary | ICD-10-CM | POA: Diagnosis not present

## 2018-08-15 DIAGNOSIS — M9903 Segmental and somatic dysfunction of lumbar region: Secondary | ICD-10-CM | POA: Diagnosis not present

## 2018-08-18 ENCOUNTER — Other Ambulatory Visit: Payer: Self-pay | Admitting: Family

## 2018-08-18 DIAGNOSIS — F32A Depression, unspecified: Secondary | ICD-10-CM

## 2018-08-18 DIAGNOSIS — F329 Major depressive disorder, single episode, unspecified: Secondary | ICD-10-CM

## 2018-09-15 DIAGNOSIS — L02426 Furuncle of left lower limb: Secondary | ICD-10-CM | POA: Diagnosis not present

## 2018-09-15 DIAGNOSIS — M9905 Segmental and somatic dysfunction of pelvic region: Secondary | ICD-10-CM | POA: Diagnosis not present

## 2018-09-15 DIAGNOSIS — L02425 Furuncle of right lower limb: Secondary | ICD-10-CM | POA: Diagnosis not present

## 2018-09-15 DIAGNOSIS — D2262 Melanocytic nevi of left upper limb, including shoulder: Secondary | ICD-10-CM | POA: Diagnosis not present

## 2018-09-15 DIAGNOSIS — M5136 Other intervertebral disc degeneration, lumbar region: Secondary | ICD-10-CM | POA: Diagnosis not present

## 2018-09-15 DIAGNOSIS — M9903 Segmental and somatic dysfunction of lumbar region: Secondary | ICD-10-CM | POA: Diagnosis not present

## 2018-09-15 DIAGNOSIS — D2272 Melanocytic nevi of left lower limb, including hip: Secondary | ICD-10-CM | POA: Diagnosis not present

## 2018-09-15 DIAGNOSIS — M62830 Muscle spasm of back: Secondary | ICD-10-CM | POA: Diagnosis not present

## 2018-10-11 DIAGNOSIS — M62830 Muscle spasm of back: Secondary | ICD-10-CM | POA: Diagnosis not present

## 2018-10-11 DIAGNOSIS — M5136 Other intervertebral disc degeneration, lumbar region: Secondary | ICD-10-CM | POA: Diagnosis not present

## 2018-10-11 DIAGNOSIS — M9903 Segmental and somatic dysfunction of lumbar region: Secondary | ICD-10-CM | POA: Diagnosis not present

## 2018-10-11 DIAGNOSIS — M9905 Segmental and somatic dysfunction of pelvic region: Secondary | ICD-10-CM | POA: Diagnosis not present

## 2018-11-06 DIAGNOSIS — M9905 Segmental and somatic dysfunction of pelvic region: Secondary | ICD-10-CM | POA: Diagnosis not present

## 2018-11-06 DIAGNOSIS — M9903 Segmental and somatic dysfunction of lumbar region: Secondary | ICD-10-CM | POA: Diagnosis not present

## 2018-11-06 DIAGNOSIS — M62830 Muscle spasm of back: Secondary | ICD-10-CM | POA: Diagnosis not present

## 2018-11-06 DIAGNOSIS — M5136 Other intervertebral disc degeneration, lumbar region: Secondary | ICD-10-CM | POA: Diagnosis not present

## 2018-12-04 ENCOUNTER — Other Ambulatory Visit: Payer: Self-pay

## 2018-12-04 DIAGNOSIS — F329 Major depressive disorder, single episode, unspecified: Secondary | ICD-10-CM

## 2018-12-04 DIAGNOSIS — F32A Depression, unspecified: Secondary | ICD-10-CM

## 2018-12-04 MED ORDER — CITALOPRAM HYDROBROMIDE 20 MG PO TABS: 40.0000 mg | ORAL_TABLET | Freq: Every day | ORAL | 1 refills | Status: DC

## 2018-12-08 DIAGNOSIS — M9905 Segmental and somatic dysfunction of pelvic region: Secondary | ICD-10-CM | POA: Diagnosis not present

## 2018-12-08 DIAGNOSIS — M62830 Muscle spasm of back: Secondary | ICD-10-CM | POA: Diagnosis not present

## 2018-12-08 DIAGNOSIS — M9903 Segmental and somatic dysfunction of lumbar region: Secondary | ICD-10-CM | POA: Diagnosis not present

## 2018-12-08 DIAGNOSIS — M5136 Other intervertebral disc degeneration, lumbar region: Secondary | ICD-10-CM | POA: Diagnosis not present

## 2018-12-18 DIAGNOSIS — M9903 Segmental and somatic dysfunction of lumbar region: Secondary | ICD-10-CM | POA: Diagnosis not present

## 2018-12-18 DIAGNOSIS — M9905 Segmental and somatic dysfunction of pelvic region: Secondary | ICD-10-CM | POA: Diagnosis not present

## 2018-12-18 DIAGNOSIS — M5136 Other intervertebral disc degeneration, lumbar region: Secondary | ICD-10-CM | POA: Diagnosis not present

## 2018-12-18 DIAGNOSIS — M62830 Muscle spasm of back: Secondary | ICD-10-CM | POA: Diagnosis not present

## 2019-01-03 DIAGNOSIS — M9905 Segmental and somatic dysfunction of pelvic region: Secondary | ICD-10-CM | POA: Diagnosis not present

## 2019-01-03 DIAGNOSIS — M5136 Other intervertebral disc degeneration, lumbar region: Secondary | ICD-10-CM | POA: Diagnosis not present

## 2019-01-03 DIAGNOSIS — M62830 Muscle spasm of back: Secondary | ICD-10-CM | POA: Diagnosis not present

## 2019-01-03 DIAGNOSIS — M9903 Segmental and somatic dysfunction of lumbar region: Secondary | ICD-10-CM | POA: Diagnosis not present

## 2019-01-19 DIAGNOSIS — M9905 Segmental and somatic dysfunction of pelvic region: Secondary | ICD-10-CM | POA: Diagnosis not present

## 2019-01-19 DIAGNOSIS — M5136 Other intervertebral disc degeneration, lumbar region: Secondary | ICD-10-CM | POA: Diagnosis not present

## 2019-01-19 DIAGNOSIS — M62830 Muscle spasm of back: Secondary | ICD-10-CM | POA: Diagnosis not present

## 2019-01-19 DIAGNOSIS — M9903 Segmental and somatic dysfunction of lumbar region: Secondary | ICD-10-CM | POA: Diagnosis not present

## 2019-02-02 DIAGNOSIS — M62830 Muscle spasm of back: Secondary | ICD-10-CM | POA: Diagnosis not present

## 2019-02-02 DIAGNOSIS — M9905 Segmental and somatic dysfunction of pelvic region: Secondary | ICD-10-CM | POA: Diagnosis not present

## 2019-02-02 DIAGNOSIS — M5136 Other intervertebral disc degeneration, lumbar region: Secondary | ICD-10-CM | POA: Diagnosis not present

## 2019-02-02 DIAGNOSIS — M9903 Segmental and somatic dysfunction of lumbar region: Secondary | ICD-10-CM | POA: Diagnosis not present

## 2019-02-06 ENCOUNTER — Encounter: Payer: Self-pay | Admitting: Family

## 2019-02-06 DIAGNOSIS — M722 Plantar fascial fibromatosis: Secondary | ICD-10-CM

## 2019-02-07 MED ORDER — MELOXICAM 15 MG PO TABS: 15.0000 mg | ORAL_TABLET | Freq: Every day | ORAL | 0 refills | Status: DC | PRN

## 2019-02-19 DIAGNOSIS — M9905 Segmental and somatic dysfunction of pelvic region: Secondary | ICD-10-CM | POA: Diagnosis not present

## 2019-02-19 DIAGNOSIS — M9903 Segmental and somatic dysfunction of lumbar region: Secondary | ICD-10-CM | POA: Diagnosis not present

## 2019-02-19 DIAGNOSIS — M62830 Muscle spasm of back: Secondary | ICD-10-CM | POA: Diagnosis not present

## 2019-02-19 DIAGNOSIS — M5136 Other intervertebral disc degeneration, lumbar region: Secondary | ICD-10-CM | POA: Diagnosis not present

## 2019-02-21 ENCOUNTER — Other Ambulatory Visit: Payer: Self-pay

## 2019-02-21 DIAGNOSIS — F329 Major depressive disorder, single episode, unspecified: Secondary | ICD-10-CM

## 2019-02-21 DIAGNOSIS — F32A Depression, unspecified: Secondary | ICD-10-CM

## 2019-02-21 MED ORDER — BUPROPION HCL ER (XL) 300 MG PO TB24: 300.0000 mg | ORAL_TABLET | Freq: Every day | ORAL | 1 refills | Status: DC

## 2019-03-01 DIAGNOSIS — M62830 Muscle spasm of back: Secondary | ICD-10-CM | POA: Diagnosis not present

## 2019-03-01 DIAGNOSIS — M5136 Other intervertebral disc degeneration, lumbar region: Secondary | ICD-10-CM | POA: Diagnosis not present

## 2019-03-01 DIAGNOSIS — M9903 Segmental and somatic dysfunction of lumbar region: Secondary | ICD-10-CM | POA: Diagnosis not present

## 2019-03-01 DIAGNOSIS — M9905 Segmental and somatic dysfunction of pelvic region: Secondary | ICD-10-CM | POA: Diagnosis not present

## 2019-03-02 DIAGNOSIS — E291 Testicular hypofunction: Secondary | ICD-10-CM | POA: Diagnosis not present

## 2019-03-02 DIAGNOSIS — Z79899 Other long term (current) drug therapy: Secondary | ICD-10-CM | POA: Diagnosis not present

## 2019-03-02 DIAGNOSIS — N401 Enlarged prostate with lower urinary tract symptoms: Secondary | ICD-10-CM | POA: Diagnosis not present

## 2019-03-12 DIAGNOSIS — M5136 Other intervertebral disc degeneration, lumbar region: Secondary | ICD-10-CM | POA: Diagnosis not present

## 2019-03-12 DIAGNOSIS — M9903 Segmental and somatic dysfunction of lumbar region: Secondary | ICD-10-CM | POA: Diagnosis not present

## 2019-03-12 DIAGNOSIS — M62830 Muscle spasm of back: Secondary | ICD-10-CM | POA: Diagnosis not present

## 2019-03-12 DIAGNOSIS — E291 Testicular hypofunction: Secondary | ICD-10-CM | POA: Diagnosis not present

## 2019-03-12 DIAGNOSIS — N5201 Erectile dysfunction due to arterial insufficiency: Secondary | ICD-10-CM | POA: Diagnosis not present

## 2019-03-12 DIAGNOSIS — E6609 Other obesity due to excess calories: Secondary | ICD-10-CM | POA: Diagnosis not present

## 2019-03-12 DIAGNOSIS — M9905 Segmental and somatic dysfunction of pelvic region: Secondary | ICD-10-CM | POA: Diagnosis not present

## 2019-03-12 DIAGNOSIS — Z79899 Other long term (current) drug therapy: Secondary | ICD-10-CM | POA: Diagnosis not present

## 2019-03-22 ENCOUNTER — Other Ambulatory Visit: Payer: Self-pay | Admitting: Family

## 2019-03-22 DIAGNOSIS — M722 Plantar fascial fibromatosis: Secondary | ICD-10-CM

## 2019-03-26 DIAGNOSIS — M5136 Other intervertebral disc degeneration, lumbar region: Secondary | ICD-10-CM | POA: Diagnosis not present

## 2019-03-26 DIAGNOSIS — M9903 Segmental and somatic dysfunction of lumbar region: Secondary | ICD-10-CM | POA: Diagnosis not present

## 2019-03-26 DIAGNOSIS — M62830 Muscle spasm of back: Secondary | ICD-10-CM | POA: Diagnosis not present

## 2019-03-26 DIAGNOSIS — M9905 Segmental and somatic dysfunction of pelvic region: Secondary | ICD-10-CM | POA: Diagnosis not present

## 2019-04-09 DIAGNOSIS — M9905 Segmental and somatic dysfunction of pelvic region: Secondary | ICD-10-CM | POA: Diagnosis not present

## 2019-04-09 DIAGNOSIS — M62830 Muscle spasm of back: Secondary | ICD-10-CM | POA: Diagnosis not present

## 2019-04-09 DIAGNOSIS — M9903 Segmental and somatic dysfunction of lumbar region: Secondary | ICD-10-CM | POA: Diagnosis not present

## 2019-04-09 DIAGNOSIS — M5136 Other intervertebral disc degeneration, lumbar region: Secondary | ICD-10-CM | POA: Diagnosis not present

## 2019-04-23 DIAGNOSIS — M9903 Segmental and somatic dysfunction of lumbar region: Secondary | ICD-10-CM | POA: Diagnosis not present

## 2019-04-23 DIAGNOSIS — M62830 Muscle spasm of back: Secondary | ICD-10-CM | POA: Diagnosis not present

## 2019-04-23 DIAGNOSIS — M5136 Other intervertebral disc degeneration, lumbar region: Secondary | ICD-10-CM | POA: Diagnosis not present

## 2019-04-23 DIAGNOSIS — M9905 Segmental and somatic dysfunction of pelvic region: Secondary | ICD-10-CM | POA: Diagnosis not present

## 2019-04-24 ENCOUNTER — Other Ambulatory Visit: Payer: Self-pay | Admitting: Family

## 2019-04-24 DIAGNOSIS — M722 Plantar fascial fibromatosis: Secondary | ICD-10-CM

## 2019-05-04 ENCOUNTER — Other Ambulatory Visit: Payer: Self-pay | Admitting: Family

## 2019-05-04 DIAGNOSIS — F32A Depression, unspecified: Secondary | ICD-10-CM

## 2019-05-04 DIAGNOSIS — F329 Major depressive disorder, single episode, unspecified: Secondary | ICD-10-CM

## 2019-05-07 DIAGNOSIS — M62830 Muscle spasm of back: Secondary | ICD-10-CM | POA: Diagnosis not present

## 2019-05-07 DIAGNOSIS — M9903 Segmental and somatic dysfunction of lumbar region: Secondary | ICD-10-CM | POA: Diagnosis not present

## 2019-05-07 DIAGNOSIS — M9905 Segmental and somatic dysfunction of pelvic region: Secondary | ICD-10-CM | POA: Diagnosis not present

## 2019-05-07 DIAGNOSIS — M5136 Other intervertebral disc degeneration, lumbar region: Secondary | ICD-10-CM | POA: Diagnosis not present

## 2019-05-21 ENCOUNTER — Other Ambulatory Visit: Payer: Self-pay | Admitting: Family

## 2019-05-21 DIAGNOSIS — M722 Plantar fascial fibromatosis: Secondary | ICD-10-CM

## 2019-05-25 DIAGNOSIS — M9905 Segmental and somatic dysfunction of pelvic region: Secondary | ICD-10-CM | POA: Diagnosis not present

## 2019-05-25 DIAGNOSIS — E291 Testicular hypofunction: Secondary | ICD-10-CM | POA: Diagnosis not present

## 2019-05-25 DIAGNOSIS — E6609 Other obesity due to excess calories: Secondary | ICD-10-CM | POA: Diagnosis not present

## 2019-05-25 DIAGNOSIS — Z79899 Other long term (current) drug therapy: Secondary | ICD-10-CM | POA: Diagnosis not present

## 2019-05-25 DIAGNOSIS — M9903 Segmental and somatic dysfunction of lumbar region: Secondary | ICD-10-CM | POA: Diagnosis not present

## 2019-05-25 DIAGNOSIS — M62830 Muscle spasm of back: Secondary | ICD-10-CM | POA: Diagnosis not present

## 2019-05-25 DIAGNOSIS — M5136 Other intervertebral disc degeneration, lumbar region: Secondary | ICD-10-CM | POA: Diagnosis not present

## 2019-05-25 DIAGNOSIS — N5201 Erectile dysfunction due to arterial insufficiency: Secondary | ICD-10-CM | POA: Diagnosis not present

## 2019-05-27 ENCOUNTER — Other Ambulatory Visit: Payer: Self-pay | Admitting: Family

## 2019-05-27 DIAGNOSIS — M722 Plantar fascial fibromatosis: Secondary | ICD-10-CM

## 2019-06-07 DIAGNOSIS — E291 Testicular hypofunction: Secondary | ICD-10-CM | POA: Diagnosis not present

## 2019-06-12 DIAGNOSIS — M62830 Muscle spasm of back: Secondary | ICD-10-CM | POA: Diagnosis not present

## 2019-06-12 DIAGNOSIS — M9903 Segmental and somatic dysfunction of lumbar region: Secondary | ICD-10-CM | POA: Diagnosis not present

## 2019-06-12 DIAGNOSIS — M5416 Radiculopathy, lumbar region: Secondary | ICD-10-CM | POA: Diagnosis not present

## 2019-06-12 DIAGNOSIS — M5136 Other intervertebral disc degeneration, lumbar region: Secondary | ICD-10-CM | POA: Diagnosis not present

## 2019-06-19 ENCOUNTER — Other Ambulatory Visit: Payer: Self-pay | Admitting: Family

## 2019-06-19 DIAGNOSIS — I10 Essential (primary) hypertension: Secondary | ICD-10-CM

## 2019-07-04 ENCOUNTER — Other Ambulatory Visit: Payer: Self-pay

## 2019-07-04 ENCOUNTER — Encounter: Payer: Self-pay | Admitting: Podiatry

## 2019-07-04 ENCOUNTER — Ambulatory Visit (INDEPENDENT_AMBULATORY_CARE_PROVIDER_SITE_OTHER): Payer: Federal, State, Local not specified - PPO | Admitting: Podiatry

## 2019-07-04 DIAGNOSIS — M722 Plantar fascial fibromatosis: Secondary | ICD-10-CM | POA: Diagnosis not present

## 2019-07-04 MED ORDER — MELOXICAM 15 MG PO TABS: 15.0000 mg | ORAL_TABLET | Freq: Every day | ORAL | 3 refills | Status: DC

## 2019-07-04 MED ORDER — METHYLPREDNISOLONE 4 MG PO TBPK: ORAL_TABLET | ORAL | 0 refills | Status: DC

## 2019-07-04 NOTE — Progress Notes (Signed)
He presents today after having not seen him for a little more than a year with a chief complaint of a painful right heel states that the right heel is flared up again and has been so for about a month or so he states that is exquisitely tender and hard to touch hard to walk on.  He states that the left one is doing fine I do not know if the orthotics are starting to wear out or what is going on.  Objective: Vital signs are stable alert oriented x3.  Pulses are palpable.  There is no erythema edema cellulitis drainage or odor he has pain on palpation medial calcaneal tubercle of the right heel.  Assessment: Pain in limb secondary to plantar fasciitis recurrent/chronic right foot.  Plan: At this point I injected him once again 20 mg Kenalog 5 mg Marcaine.  I also put him on methylprednisolone 4 mg over 6 days.  He will follow up with Lil[facility] Channel for orthotics.  He will start back on his meloxicam after methylprednisolone.  Follow-up with him in 1 month

## 2019-07-06 DIAGNOSIS — M62830 Muscle spasm of back: Secondary | ICD-10-CM | POA: Diagnosis not present

## 2019-07-06 DIAGNOSIS — M5136 Other intervertebral disc degeneration, lumbar region: Secondary | ICD-10-CM | POA: Diagnosis not present

## 2019-07-06 DIAGNOSIS — M5416 Radiculopathy, lumbar region: Secondary | ICD-10-CM | POA: Diagnosis not present

## 2019-07-06 DIAGNOSIS — M9903 Segmental and somatic dysfunction of lumbar region: Secondary | ICD-10-CM | POA: Diagnosis not present

## 2019-07-09 ENCOUNTER — Other Ambulatory Visit: Payer: Federal, State, Local not specified - PPO | Admitting: Orthotics

## 2019-07-09 DIAGNOSIS — L02221 Furuncle of abdominal wall: Secondary | ICD-10-CM | POA: Diagnosis not present

## 2019-07-18 ENCOUNTER — Other Ambulatory Visit: Payer: Self-pay

## 2019-07-18 ENCOUNTER — Ambulatory Visit (INDEPENDENT_AMBULATORY_CARE_PROVIDER_SITE_OTHER): Payer: Federal, State, Local not specified - PPO | Admitting: Orthotics

## 2019-07-18 DIAGNOSIS — M722 Plantar fascial fibromatosis: Secondary | ICD-10-CM | POA: Diagnosis not present

## 2019-07-18 NOTE — Progress Notes (Signed)
Cavus foot type, need f/o with arch support R/F stability

## 2019-07-24 DIAGNOSIS — M9903 Segmental and somatic dysfunction of lumbar region: Secondary | ICD-10-CM | POA: Diagnosis not present

## 2019-07-24 DIAGNOSIS — M5416 Radiculopathy, lumbar region: Secondary | ICD-10-CM | POA: Diagnosis not present

## 2019-07-24 DIAGNOSIS — M62830 Muscle spasm of back: Secondary | ICD-10-CM | POA: Diagnosis not present

## 2019-07-24 DIAGNOSIS — M5136 Other intervertebral disc degeneration, lumbar region: Secondary | ICD-10-CM | POA: Diagnosis not present

## 2019-08-01 ENCOUNTER — Ambulatory Visit: Payer: Federal, State, Local not specified - PPO | Admitting: Podiatry

## 2019-08-01 ENCOUNTER — Encounter: Payer: Self-pay | Admitting: Podiatry

## 2019-08-01 ENCOUNTER — Other Ambulatory Visit: Payer: Self-pay

## 2019-08-01 DIAGNOSIS — M7661 Achilles tendinitis, right leg: Secondary | ICD-10-CM

## 2019-08-01 DIAGNOSIS — M722 Plantar fascial fibromatosis: Secondary | ICD-10-CM

## 2019-08-01 NOTE — Progress Notes (Signed)
He presents today for follow-up of his right heel states that is still about the same the last shot really did not do anything but he is looking forward to getting his orthotics back on the 19th from [place].  Objective: Vital signs are stable he is alert and oriented x3.  Pulses are palpable.  He has pain on palpation medial calcaneal tubercle of the right heel and on palpation of the right tendo Achilles at its insertion site.  Assessment: Achilles tendinitis plan fasciitis.  Plan: I reinjected the area today with Celestone and dexamethasone.  Plantar fascia and the Achilles.  I will follow-up with him after his orthotics.Marland Kitchen

## 2019-08-08 ENCOUNTER — Other Ambulatory Visit: Payer: Self-pay

## 2019-08-08 ENCOUNTER — Ambulatory Visit (INDEPENDENT_AMBULATORY_CARE_PROVIDER_SITE_OTHER): Payer: Federal, State, Local not specified - PPO | Admitting: Orthotics

## 2019-08-08 DIAGNOSIS — M722 Plantar fascial fibromatosis: Secondary | ICD-10-CM

## 2019-08-08 NOTE — Progress Notes (Signed)
Patient came in today to pick up custom made foot orthotics.  The goals were accomplished and the patient reported no dissatisfaction with said orthotics.  Patient was advised of breakin period and how to report any issues. 

## 2019-08-13 DIAGNOSIS — M9903 Segmental and somatic dysfunction of lumbar region: Secondary | ICD-10-CM | POA: Diagnosis not present

## 2019-08-13 DIAGNOSIS — M5416 Radiculopathy, lumbar region: Secondary | ICD-10-CM | POA: Diagnosis not present

## 2019-08-13 DIAGNOSIS — M5136 Other intervertebral disc degeneration, lumbar region: Secondary | ICD-10-CM | POA: Diagnosis not present

## 2019-08-13 DIAGNOSIS — M62830 Muscle spasm of back: Secondary | ICD-10-CM | POA: Diagnosis not present

## 2019-08-30 ENCOUNTER — Other Ambulatory Visit: Payer: Self-pay | Admitting: Family

## 2019-08-30 DIAGNOSIS — F32A Depression, unspecified: Secondary | ICD-10-CM

## 2019-09-03 DIAGNOSIS — M5136 Other intervertebral disc degeneration, lumbar region: Secondary | ICD-10-CM | POA: Diagnosis not present

## 2019-09-03 DIAGNOSIS — M5416 Radiculopathy, lumbar region: Secondary | ICD-10-CM | POA: Diagnosis not present

## 2019-09-03 DIAGNOSIS — M62830 Muscle spasm of back: Secondary | ICD-10-CM | POA: Diagnosis not present

## 2019-09-03 DIAGNOSIS — M9903 Segmental and somatic dysfunction of lumbar region: Secondary | ICD-10-CM | POA: Diagnosis not present

## 2019-09-12 DIAGNOSIS — E6609 Other obesity due to excess calories: Secondary | ICD-10-CM | POA: Diagnosis not present

## 2019-09-12 DIAGNOSIS — E291 Testicular hypofunction: Secondary | ICD-10-CM | POA: Diagnosis not present

## 2019-09-12 DIAGNOSIS — N5201 Erectile dysfunction due to arterial insufficiency: Secondary | ICD-10-CM | POA: Diagnosis not present

## 2019-09-12 DIAGNOSIS — Z79899 Other long term (current) drug therapy: Secondary | ICD-10-CM | POA: Diagnosis not present

## 2019-09-14 DIAGNOSIS — E291 Testicular hypofunction: Secondary | ICD-10-CM | POA: Diagnosis not present

## 2019-09-14 DIAGNOSIS — D2261 Melanocytic nevi of right upper limb, including shoulder: Secondary | ICD-10-CM | POA: Diagnosis not present

## 2019-09-14 DIAGNOSIS — N401 Enlarged prostate with lower urinary tract symptoms: Secondary | ICD-10-CM | POA: Diagnosis not present

## 2019-09-14 DIAGNOSIS — N5201 Erectile dysfunction due to arterial insufficiency: Secondary | ICD-10-CM | POA: Diagnosis not present

## 2019-09-14 DIAGNOSIS — Z79899 Other long term (current) drug therapy: Secondary | ICD-10-CM | POA: Diagnosis not present

## 2019-09-14 DIAGNOSIS — L02426 Furuncle of left lower limb: Secondary | ICD-10-CM | POA: Diagnosis not present

## 2019-09-14 DIAGNOSIS — L02425 Furuncle of right lower limb: Secondary | ICD-10-CM | POA: Diagnosis not present

## 2019-09-14 DIAGNOSIS — D225 Melanocytic nevi of trunk: Secondary | ICD-10-CM | POA: Diagnosis not present

## 2019-09-20 DIAGNOSIS — M9903 Segmental and somatic dysfunction of lumbar region: Secondary | ICD-10-CM | POA: Diagnosis not present

## 2019-09-20 DIAGNOSIS — M5416 Radiculopathy, lumbar region: Secondary | ICD-10-CM | POA: Diagnosis not present

## 2019-09-20 DIAGNOSIS — M5136 Other intervertebral disc degeneration, lumbar region: Secondary | ICD-10-CM | POA: Diagnosis not present

## 2019-09-20 DIAGNOSIS — M62830 Muscle spasm of back: Secondary | ICD-10-CM | POA: Diagnosis not present

## 2019-09-28 DIAGNOSIS — M79676 Pain in unspecified toe(s): Secondary | ICD-10-CM

## 2019-10-08 ENCOUNTER — Ambulatory Visit (INDEPENDENT_AMBULATORY_CARE_PROVIDER_SITE_OTHER): Payer: Federal, State, Local not specified - PPO | Admitting: Podiatry

## 2019-10-08 ENCOUNTER — Other Ambulatory Visit: Payer: Self-pay

## 2019-10-08 ENCOUNTER — Encounter: Payer: Self-pay | Admitting: Podiatry

## 2019-10-08 DIAGNOSIS — M722 Plantar fascial fibromatosis: Secondary | ICD-10-CM | POA: Diagnosis not present

## 2019-10-08 DIAGNOSIS — M7661 Achilles tendinitis, right leg: Secondary | ICD-10-CM | POA: Diagnosis not present

## 2019-10-08 NOTE — Progress Notes (Signed)
He presents today states that is really feeling about the same as he refers to the tendo Achilles of his right posterior heel.  He states that it may be a little bit better but is only because I have been doing as much as I have in the past.  Objective: Vital signs are stable alert and oriented x3.  Pulses are palpable.  There is mild erythema on the posterior aspect of the Achilles area with warm area to the touch.  This is at the Achilles insertion site.  He also has tenderness on palpation of the plantar medial calcaneal tubercle.  The pain is just as severe as it has been since April.  He has not regressed at all with the use of orthotics injection steroids nonsteroidals and immobilization.  Assessment: Probable tear of the Achilles and plantar fascia of the right foot and ankle secondary to chronic inflammation.  Plan: Discussed etiology pathology conservative versus surgical therapies at this point I went ahead and expressed to him that he needs to be able to take as much time off as he needs for the healing of this tendon and ligament.  I will request FMLA be filled out so that he may have leave from work if he is uncomfortable or in pain.  Also requesting MRI of the ankle and rear foot to reevaluate plantar fasciitis and the integrity of the Achilles tendon.  In the meantime he is to continue the use of his orthotics and he will use Voltaren gel up to 4 times a day.

## 2019-10-11 DIAGNOSIS — M5136 Other intervertebral disc degeneration, lumbar region: Secondary | ICD-10-CM | POA: Diagnosis not present

## 2019-10-11 DIAGNOSIS — M9903 Segmental and somatic dysfunction of lumbar region: Secondary | ICD-10-CM | POA: Diagnosis not present

## 2019-10-11 DIAGNOSIS — M62830 Muscle spasm of back: Secondary | ICD-10-CM | POA: Diagnosis not present

## 2019-10-11 DIAGNOSIS — M5416 Radiculopathy, lumbar region: Secondary | ICD-10-CM | POA: Diagnosis not present

## 2019-10-12 ENCOUNTER — Telehealth: Payer: Self-pay

## 2019-10-12 DIAGNOSIS — M7661 Achilles tendinitis, right leg: Secondary | ICD-10-CM

## 2019-10-12 NOTE — Telephone Encounter (Signed)
Per Marnee Spring. With [employer], no precert required for outpatient imaging.    Patient has been notified via voice mail and instructed to call scheduling dept to set up app to his convenience.

## 2019-10-15 DIAGNOSIS — M79676 Pain in unspecified toe(s): Secondary | ICD-10-CM

## 2019-11-05 DIAGNOSIS — M5136 Other intervertebral disc degeneration, lumbar region: Secondary | ICD-10-CM | POA: Diagnosis not present

## 2019-11-05 DIAGNOSIS — M62830 Muscle spasm of back: Secondary | ICD-10-CM | POA: Diagnosis not present

## 2019-11-05 DIAGNOSIS — M9903 Segmental and somatic dysfunction of lumbar region: Secondary | ICD-10-CM | POA: Diagnosis not present

## 2019-11-05 DIAGNOSIS — M5416 Radiculopathy, lumbar region: Secondary | ICD-10-CM | POA: Diagnosis not present

## 2019-11-06 ENCOUNTER — Other Ambulatory Visit: Payer: Self-pay

## 2019-11-06 ENCOUNTER — Ambulatory Visit
Admission: RE | Admit: 2019-11-06 | Discharge: 2019-11-06 | Disposition: A | Discharge status: Home / Self Care | Payer: Federal, State, Local not specified - PPO | Source: Ambulatory Visit | Attending: Podiatry | Admitting: Podiatry

## 2019-11-06 DIAGNOSIS — R600 Localized edema: Secondary | ICD-10-CM | POA: Diagnosis not present

## 2019-11-06 DIAGNOSIS — S86011A Strain of right Achilles tendon, initial encounter: Secondary | ICD-10-CM | POA: Diagnosis not present

## 2019-11-06 DIAGNOSIS — M7661 Achilles tendinitis, right leg: Secondary | ICD-10-CM | POA: Diagnosis not present

## 2019-11-08 ENCOUNTER — Telehealth: Payer: Self-pay | Admitting: Podiatry

## 2019-11-08 ENCOUNTER — Encounter: Payer: Self-pay | Admitting: Podiatry

## 2019-11-08 NOTE — Telephone Encounter (Signed)
Mailed letter to patient to call the office and schedule appointment.

## 2019-11-08 NOTE — Telephone Encounter (Signed)
Called patient to schedule an MRI follow up/surgery consult per message from Dr.Hyatt's assistant, Caryl Pina.  Unable to leave voicemail, call went straight to voicemail and mailbox was full. No other phone number on file for patient.

## 2019-11-09 ENCOUNTER — Other Ambulatory Visit: Payer: Self-pay | Admitting: Podiatry

## 2019-12-05 DIAGNOSIS — M5136 Other intervertebral disc degeneration, lumbar region: Secondary | ICD-10-CM | POA: Diagnosis not present

## 2019-12-05 DIAGNOSIS — M5416 Radiculopathy, lumbar region: Secondary | ICD-10-CM | POA: Diagnosis not present

## 2019-12-05 DIAGNOSIS — M62830 Muscle spasm of back: Secondary | ICD-10-CM | POA: Diagnosis not present

## 2019-12-05 DIAGNOSIS — M9903 Segmental and somatic dysfunction of lumbar region: Secondary | ICD-10-CM | POA: Diagnosis not present

## 2019-12-12 ENCOUNTER — Telehealth: Payer: Self-pay | Admitting: Family

## 2019-12-12 NOTE — Telephone Encounter (Signed)
Patient called in response to a [place] message that it is time for his colonoscopy. Patient is requesting a referral to have one.

## 2019-12-13 ENCOUNTER — Other Ambulatory Visit: Payer: Self-pay | Admitting: Family

## 2019-12-13 DIAGNOSIS — Z1211 Encounter for screening for malignant neoplasm of colon: Secondary | ICD-10-CM

## 2019-12-13 NOTE — Telephone Encounter (Signed)
LM letting patient now that referral was placed & if he did not hear back in 7-10 business days to call back.

## 2019-12-13 NOTE — Telephone Encounter (Signed)
Call pt Ref for colonoscopy placed Let us know if you dont hear back within a week in regards to an appointment being scheduled.

## 2019-12-20 ENCOUNTER — Encounter: Payer: Self-pay | Admitting: General Practice

## 2019-12-21 DIAGNOSIS — M5416 Radiculopathy, lumbar region: Secondary | ICD-10-CM | POA: Diagnosis not present

## 2019-12-21 DIAGNOSIS — M9903 Segmental and somatic dysfunction of lumbar region: Secondary | ICD-10-CM | POA: Diagnosis not present

## 2019-12-21 DIAGNOSIS — M62830 Muscle spasm of back: Secondary | ICD-10-CM | POA: Diagnosis not present

## 2019-12-21 DIAGNOSIS — M5136 Other intervertebral disc degeneration, lumbar region: Secondary | ICD-10-CM | POA: Diagnosis not present

## 2019-12-31 ENCOUNTER — Other Ambulatory Visit: Payer: Self-pay | Admitting: Family

## 2019-12-31 DIAGNOSIS — M5136 Other intervertebral disc degeneration, lumbar region: Secondary | ICD-10-CM | POA: Diagnosis not present

## 2019-12-31 DIAGNOSIS — M9903 Segmental and somatic dysfunction of lumbar region: Secondary | ICD-10-CM | POA: Diagnosis not present

## 2019-12-31 DIAGNOSIS — F32A Depression, unspecified: Secondary | ICD-10-CM

## 2019-12-31 DIAGNOSIS — M5416 Radiculopathy, lumbar region: Secondary | ICD-10-CM | POA: Diagnosis not present

## 2019-12-31 DIAGNOSIS — M62830 Muscle spasm of back: Secondary | ICD-10-CM | POA: Diagnosis not present

## 2020-01-03 ENCOUNTER — Telehealth: Payer: Self-pay

## 2020-01-03 ENCOUNTER — Telehealth: Payer: Federal, State, Local not specified - PPO

## 2020-01-03 NOTE — Telephone Encounter (Signed)
Attempted to contact patient at 3:30pm and 3:35pm to schedule colonoscopy.  Unable to LVM due to voicemail being full.  Sending mychart message as well.  Thanks,  [place], [place]

## 2020-01-14 ENCOUNTER — Ambulatory Visit: Payer: Federal, State, Local not specified - PPO | Admitting: Podiatry

## 2020-01-14 ENCOUNTER — Encounter: Payer: Self-pay | Admitting: Podiatry

## 2020-01-14 ENCOUNTER — Other Ambulatory Visit: Payer: Self-pay

## 2020-01-14 ENCOUNTER — Telehealth (INDEPENDENT_AMBULATORY_CARE_PROVIDER_SITE_OTHER): Payer: Federal, State, Local not specified - PPO | Admitting: Gastroenterology

## 2020-01-14 DIAGNOSIS — Z1211 Encounter for screening for malignant neoplasm of colon: Secondary | ICD-10-CM

## 2020-01-14 DIAGNOSIS — S86011D Strain of right Achilles tendon, subsequent encounter: Secondary | ICD-10-CM | POA: Diagnosis not present

## 2020-01-14 DIAGNOSIS — M7731 Calcaneal spur, right foot: Secondary | ICD-10-CM | POA: Diagnosis not present

## 2020-01-14 MED ORDER — PEG 3350-KCL-NA BICARB-NACL 420 G PO SOLR: 4000.0000 mL | Freq: Once | ORAL | 0 refills | Status: AC

## 2020-01-14 NOTE — Progress Notes (Signed)
Gastroenterology Pre-Procedure Review  Request Date: Friday 01/18/20 Requesting Physician: Dr. Bonna Gains  PATIENT REVIEW QUESTIONS: The patient responded to the following health history questions as indicated:    1. Are you having any GI issues? no 2. Do you have a personal history of Polyps? no 3. Do you have a family history of Colon Cancer or Polyps? yes (maternal uncle had colon cancer) 4. Diabetes Mellitus? no 5. Joint replacements in the past 12 months?no 6. Major health problems in the past 3 months?no 7. Any artificial heart valves, MVP, or defibrillator?no    MEDICATIONS & ALLERGIES:    Patient reports the following regarding taking any anticoagulation/antiplatelet therapy:   Plavix, Coumadin, Eliquis, Xarelto, Lovenox, Pradaxa, Brilinta, or Effient? no Aspirin? no  Patient confirms/reports the following medications:  Current Outpatient Medications  Medication Sig Dispense Refill  . Ascorbic Acid (VITAMIN C) 1000 MG tablet Take 2,000 mg by mouth daily.    . benazepril (LOTENSIN) 10 MG tablet TAKE 1 TABLET(10 MG) BY MOUTH DAILY 90 tablet 2  . buPROPion (WELLBUTRIN XL) 300 MG 24 hr tablet Take 1 tablet (300 mg total) by mouth daily. 90 tablet 1  . cholecalciferol (VITAMIN D) 1000 UNITS tablet Take 2,000 Units by mouth daily.    . citalopram (CELEXA) 20 MG tablet TAKE 2 TABLETS(40 MG) BY MOUTH DAILY 120 tablet 1  . Co-Enzyme Q-10 100 MG CAPS Take 100 mg by mouth.    . meloxicam (MOBIC) 15 MG tablet TAKE 1 TABLET(15 MG) BY MOUTH DAILY 30 tablet 3  . Omega-3 Fatty Acids (FISH OIL) 1000 MG CAPS Take 2,400 mg by mouth once.    Marland Kitchen albuterol (PROVENTIL HFA;VENTOLIN HFA) 108 (90 Base) MCG/ACT inhaler INL 2 PFS PO Q 4 TO 6 H PRN (Patient not taking: Reported on 01/14/2020)  0  . anastrozole (ARIMIDEX) 1 MG tablet SMARTSIG:0.5 Tablet(s) By Mouth Every Other Day (Patient not taking: Reported on 01/14/2020)    . buPROPion (WELLBUTRIN XL) 150 MG 24 hr tablet TAKE 1 TABLET BY MOUTH EVERY  MORNING, THEN INCREASE AFTER 3 DAYS TO 2 TABLETS EVERY MORNING 60 tablet 3  . JATENZO 237 MG CAPS Take 1 capsule by mouth 2 (two) times daily.    . methylPREDNISolone (MEDROL DOSEPAK) 4 MG TBPK tablet 6 day dose pack - take as directed (Patient not taking: Reported on 01/14/2020) 21 tablet 0  . polyethylene glycol-electrolytes (NULYTELY) 420 g solution Take 4,000 mLs by mouth once for 1 dose. Follow preparation instructions for nultyely bowel prep.  Drink 8 oz every 30 minutes until entire contents have been completed. 4000 mL 0  . XYOSTED 100 MG/0.5ML SOAJ SMARTSIG:1 Pre-Filled Pen Syringe SUB-Q Once a Week (Patient not taking: Reported on 01/14/2020)    . Zinc Sulfate (ZINC 15) 66 MG TABS Take 15 mg by mouth.     No current facility-administered medications for this visit.    Patient confirms/reports the following allergies:  Allergies  Allergen Reactions  . Tetanus Toxoids Swelling    Orders Placed This Encounter  Procedures  . Procedural/ Surgical Case Request: COLONOSCOPY WITH PROPOFOL    Standing Status:   Standing    Number of Occurrences:   1    Order Specific Question:   Pre-op diagnosis    Answer:   screening colonoscopy    Order Specific Question:   CPT Code    Answer:   14431    AUTHORIZATION INFORMATION Primary Insurance: 1D#: Group #:  Secondary Insurance: 1D#: Group #:  SCHEDULE INFORMATION: Date:  Friday 01/18/20 Time: Location:[facility]

## 2020-01-14 NOTE — Progress Notes (Signed)
He presents today for follow-up of his MRI regarding his right foot.  States that about not now in the back of my heel I have more flexibility and the tenderness is gone down to some degree.  I reviewed his past medical history medications allergies surgery social history and review of systems.  ROS: Denies fever chills nausea vomiting muscle aches pains calf pain back pain chest pain shortness of breath.  Objective: Vital signs are stable he is alert oriented x3 pulses remain palpable right lower extremity nodular mass appears to be somewhat fluctuant but nonpulsatile posterior aspect Achilles tendon there is obvious narrowing within the distal portion of the fibers of the Achilles.  This is also indicated by the MRI stating that near complete rupture of the Achilles is occurring.  Assessment: Near complete rupture of the Achilles tendon right with a heel spur.  Plan: Discussed etiology pathology conservative versus surgical therapies.  At this point we consented him today for a retrocalcaneal heel spur resection gastroc recession and a primary Achilles repair with cast.  We discussed the pros and cons of surgery the fact that this probably be done with a catheter.  He understands that possible side effects which may include but not limited to postop pain bleeding swelling infection recurrence need for further surgery overcorrection under correction loss of digit loss of limb loss of life follow-up with him in the near future for surgical intervention.

## 2020-01-16 ENCOUNTER — Other Ambulatory Visit: Payer: Self-pay

## 2020-01-16 ENCOUNTER — Other Ambulatory Visit
Admission: RE | Admit: 2020-01-16 | Discharge: 2020-01-16 | Disposition: A | Discharge status: Home / Self Care | Payer: Federal, State, Local not specified - PPO | Source: Ambulatory Visit | Attending: Gastroenterology | Admitting: Gastroenterology

## 2020-01-16 DIAGNOSIS — M5416 Radiculopathy, lumbar region: Secondary | ICD-10-CM | POA: Diagnosis not present

## 2020-01-16 DIAGNOSIS — M62830 Muscle spasm of back: Secondary | ICD-10-CM | POA: Diagnosis not present

## 2020-01-16 DIAGNOSIS — M9903 Segmental and somatic dysfunction of lumbar region: Secondary | ICD-10-CM | POA: Diagnosis not present

## 2020-01-16 DIAGNOSIS — M5136 Other intervertebral disc degeneration, lumbar region: Secondary | ICD-10-CM | POA: Diagnosis not present

## 2020-01-16 DIAGNOSIS — Z01812 Encounter for preprocedural laboratory examination: Secondary | ICD-10-CM | POA: Insufficient documentation

## 2020-01-16 DIAGNOSIS — Z20822 Contact with and (suspected) exposure to covid-19: Secondary | ICD-10-CM | POA: Diagnosis not present

## 2020-01-16 LAB — SARS CORONAVIRUS 2 (TAT 6-24 HRS): SARS Coronavirus 2: NEGATIVE

## 2020-01-17 ENCOUNTER — Encounter: Payer: Self-pay | Admitting: Family

## 2020-01-17 ENCOUNTER — Ambulatory Visit (INDEPENDENT_AMBULATORY_CARE_PROVIDER_SITE_OTHER): Payer: Federal, State, Local not specified - PPO | Admitting: Family

## 2020-01-17 VITALS — BP 130/82 | HR 69 | Temp 97.8°F | Ht 75.0 in | Wt 254.4 lb

## 2020-01-17 DIAGNOSIS — F32A Depression, unspecified: Secondary | ICD-10-CM | POA: Diagnosis not present

## 2020-01-17 DIAGNOSIS — E669 Obesity, unspecified: Secondary | ICD-10-CM

## 2020-01-17 DIAGNOSIS — Z125 Encounter for screening for malignant neoplasm of prostate: Secondary | ICD-10-CM

## 2020-01-17 DIAGNOSIS — I10 Essential (primary) hypertension: Secondary | ICD-10-CM

## 2020-01-17 DIAGNOSIS — Z0000 Encounter for general adult medical examination without abnormal findings: Secondary | ICD-10-CM | POA: Diagnosis not present

## 2020-01-17 LAB — CBC WITH DIFFERENTIAL/PLATELET
Basophils Absolute: 0.1 10*3/uL (ref 0.0–0.1)
Basophils Relative: 1.2 % (ref 0.0–3.0)
Eosinophils Absolute: 0.1 10*3/uL (ref 0.0–0.7)
Eosinophils Relative: 2 % (ref 0.0–5.0)
HCT: 50 % (ref 39.0–52.0)
Hemoglobin: 16.5 g/dL (ref 13.0–17.0)
Lymphocytes Relative: 17.9 % (ref 12.0–46.0)
Lymphs Abs: 1.2 10*3/uL (ref 0.7–4.0)
MCHC: 33.1 g/dL (ref 30.0–36.0)
MCV: 86.2 fl (ref 78.0–100.0)
Monocytes Absolute: 0.8 10*3/uL (ref 0.1–1.0)
Monocytes Relative: 11.6 % (ref 3.0–12.0)
Neutro Abs: 4.5 10*3/uL (ref 1.4–7.7)
Neutrophils Relative %: 67.3 % (ref 43.0–77.0)
Platelets: 270 10*3/uL (ref 150.0–400.0)
RBC: 5.8 Mil/uL (ref 4.22–5.81)
RDW: 13.9 % (ref 11.5–15.5)
WBC: 6.7 10*3/uL (ref 4.0–10.5)

## 2020-01-17 LAB — COMPREHENSIVE METABOLIC PANEL
ALT: 22 U/L (ref 0–53)
AST: 22 U/L (ref 0–37)
Albumin: 5 g/dL (ref 3.5–5.2)
Alkaline Phosphatase: 63 U/L (ref 39–117)
BUN: 21 mg/dL (ref 6–23)
CO2: 31 mEq/L (ref 19–32)
Calcium: 9.7 mg/dL (ref 8.4–10.5)
Chloride: 98 mEq/L (ref 96–112)
Creatinine, Ser: 1.03 mg/dL (ref 0.40–1.50)
GFR: 84.26 mL/min (ref >60.00)
Glucose, Bld: 89 mg/dL (ref 70–99)
Potassium: 4.4 mEq/L (ref 3.5–5.1)
Sodium: 137 mEq/L (ref 135–145)
Total Bilirubin: 0.6 mg/dL (ref 0.2–1.2)
Total Protein: 6.8 g/dL (ref 6.0–8.3)

## 2020-01-17 LAB — LIPID PANEL
Cholesterol: 169 mg/dL (ref 0–200)
HDL: 38.6 mg/dL — ABNORMAL LOW (ref >39.00)
LDL Cholesterol: 109 mg/dL — ABNORMAL HIGH (ref 0–99)
NonHDL: 130.33
Total CHOL/HDL Ratio: 4
Triglycerides: 108 mg/dL (ref 0.0–149.0)
VLDL: 21.6 mg/dL (ref 0.0–40.0)

## 2020-01-17 LAB — PSA: PSA: 0.89 ng/mL (ref 0.10–4.00)

## 2020-01-17 LAB — VITAMIN D 25 HYDROXY (VIT D DEFICIENCY, FRACTURES): VITD: 38.74 ng/mL (ref 30.00–100.00)

## 2020-01-17 LAB — TSH: TSH: 1.16 u[IU]/mL (ref 0.35–4.50)

## 2020-01-17 LAB — HEMOGLOBIN A1C: Hgb A1c MFr Bld: 6.3 % (ref 4.6–6.5)

## 2020-01-17 NOTE — Assessment & Plan Note (Signed)
Discussed dietary modification, eating out less and following low glycemic diet. Diet discussed and provided on avs today.

## 2020-01-17 NOTE — Assessment & Plan Note (Signed)
Overall controlled. Continue wellbutrin 300mg  and celexa 20mg . He declines making any changes . Close follow up.

## 2020-01-17 NOTE — Patient Instructions (Addendum)
Take benzapril IN THE EVENING.  It is imperative that you are seen AT least twice per year for labs and monitoring. Monitor blood pressure at home and me 5-6 reading on separate days. Goal is less than 120/80, based on newest guidelines, however we certainly want to be less than 130/80;  if persistently higher, please make sooner follow up appointment so we can recheck you blood pressure and manage/ adjust medications. Please continue to follow with Dr Rogers Blocker , urology, and discuss decrease in urine. I have drawn your prostate ( PSA) lab today.    Health Maintenance, Male Adopting a healthy lifestyle and getting preventive care are important in promoting health and wellness. Ask your health care provider about:  The right schedule for you to have regular tests and exams.  Things you can do on your own to prevent diseases and keep yourself healthy. What should I know about diet, weight, and exercise? Eat a healthy diet   Eat a diet that includes plenty of vegetables, fruits, low-fat dairy products, and lean protein.  Do not eat a lot of foods that are high in solid fats, added sugars, or sodium. Maintain a healthy weight Body mass index (BMI) is a measurement that can be used to identify possible weight problems. It estimates body fat based on height and weight. Your health care provider can help determine your BMI and help you achieve or maintain a healthy weight. Get regular exercise Get regular exercise. This is one of the most important things you can do for your health. Most adults should:  Exercise for at least 150 minutes each week. The exercise should increase your heart rate and make you sweat (moderate-intensity exercise).  Do strengthening exercises at least twice a week. This is in addition to the moderate-intensity exercise.  Spend less time sitting. Even light physical activity can be beneficial. Watch cholesterol and blood lipids Have your blood tested for lipids and  cholesterol at 51 years of age, then have this test every 5 years. You may need to have your cholesterol levels checked more often if:  Your lipid or cholesterol levels are high.  You are older than 51 years of age.  You are at high risk for heart disease. What should I know about cancer screening? Many types of cancers can be detected early and may often be prevented. Depending on your health history and family history, you may need to have cancer screening at various ages. This may include screening for:  Colorectal cancer.  Prostate cancer.  Skin cancer.  Lung cancer. What should I know about heart disease, diabetes, and high blood pressure? Blood pressure and heart disease  High blood pressure causes heart disease and increases the risk of stroke. This is more likely to develop in people who have high blood pressure readings, are of African descent, or are overweight.  Talk with your health care provider about your target blood pressure readings.  Have your blood pressure checked: ? Every 3-5 years if you are 51-51 years of age. ? Every year if you are 51 years of age or older.  If you are between the ages of 12 and 65 and are a current or former smoker, ask your health care provider if you should have a one-time screening for abdominal aortic aneurysm (AAA). Diabetes Have regular diabetes screenings. This checks your fasting blood sugar level. Have the screening done:  Once every three years after age 51 if you are at a normal weight and have a low  risk for diabetes.  More often and at a younger age if you are overweight or have a high risk for diabetes. What should I know about preventing infection? Hepatitis B If you have a higher risk for hepatitis B, you should be screened for this virus. Talk with your health care provider to find out if you are at risk for hepatitis B infection. Hepatitis C Blood testing is recommended for:  Everyone born from [birth year] through  1965.  Anyone with known risk factors for hepatitis C. Sexually transmitted infections (STIs)  You should be screened each year for STIs, including gonorrhea and chlamydia, if: ? You are sexually active and are younger than 51 years of age. ? You are older than 51 years of age and your health care provider tells you that you are at risk for this type of infection. ? Your sexual activity has changed since you were last screened, and you are at increased risk for chlamydia or gonorrhea. Ask your health care provider if you are at risk.  Ask your health care provider about whether you are at high risk for HIV. Your health care provider may recommend a prescription medicine to help prevent HIV infection. If you choose to take medicine to prevent HIV, you should first get tested for HIV. You should then be tested every 3 months for as long as you are taking the medicine. Follow these instructions at home: Lifestyle  Do not use any products that contain nicotine or tobacco, such as cigarettes, e-cigarettes, and chewing tobacco. If you need help quitting, ask your health care provider.  Do not use street drugs.  Do not share needles.  Ask your health care provider for help if you need support or information about quitting drugs. Alcohol use  Do not drink alcohol if your health care provider tells you not to drink.  If you drink alcohol: ? Limit how much you have to 0-2 drinks a day. ? Be aware of how much alcohol is in your drink. In the [place] one drink equals one 12 oz bottle of beer (355 mL), one 5 oz glass of wine (148 mL), or one 1 oz glass of hard liquor (44 mL). General instructions  Schedule regular health, dental, and eye exams.  Stay current with your vaccines.  Tell your health care provider if: ? You often feel depressed. ? You have ever been abused or do not feel safe at home. Summary  Adopting a healthy lifestyle and getting preventive care are important in promoting  health and wellness.  Follow your health care provider's instructions about healthy diet, exercising, and getting tested or screened for diseases.  Follow your health care provider's instructions on monitoring your cholesterol and blood pressure. This information is not intended to replace advice given to you by your health care provider. Make sure you discuss any questions you have with your health care provider. Document Revised: 03/01/2018 Document Reviewed: 03/01/2018 Elsevier Patient Education  [employer].  This is  Dr. Lupita Dawn  example of a  "Low GI"  Diet:  It will allow you to lose 4 to 8  lbs  per month if you follow it carefully.  Your goal with exercise is a minimum of 30 minutes of aerobic exercise 5 days per week (Walking does not count once it becomes easy!)    All of the foods can be found at grocery stores and in bulk at [employer].  The Atkins protein bars and shakes are available  in more varieties at Target, WalMart and [employer]'s Foods.     7 AM Breakfast:  Choose from the following:  Low carbohydrate Protein  Shakes (I recommend the  Premier Protein chocolate shakes,  EAS AdvantEdge "Carb Control" shakes  Or the Atkins shakes all are under 3 net carbs)     a scrambled egg/bacon/cheese burrito made with Mission's "carb balance" whole wheat tortilla  (about 10 net carbs )  Regulatory affairs officer (basically a quiche without the pastry crust) that is eaten cold and very convenient way to get your eggs.  8 carbs)  If you make your own protein shakes, avoid bananas and pineapple,  And use low carb greek yogurt or original /unsweetened almond or soy milk    Avoid cereal and bananas, oatmeal and cream of wheat and grits. They are loaded with carbohydrates!   10 AM: high protein snack:  Protein bar by Atkins (the snack size, under 200 cal, usually < 6 net carbs).    A stick of cheese:  Around 1 carb,  100 cal     Dannon Light n Fit [place] Yogurt  (80 cal, 8  carbs)  Other so called "protein bars" and Greek yogurts tend to be loaded with carbohydrates.  Remember, in food advertising, the word "energy" is synonymous for " carbohydrate."  Lunch:   A Sandwich using the bread choices listed, Can use any  Eggs,  lunchmeat, grilled meat or canned tuna), avocado, regular mayo/mustard  and cheese.  A Salad using blue cheese, ranch,  Goddess or vinagrette,  Avoid taco shells, croutons or "confetti" and no "candied nuts" but regular nuts OK.   No pretzels, nabs  or chips.  Pickles and miniature sweet peppers are a good low carb alternative that provide a "crunch"  The bread is the only source of carbohydrate in a sandwich and  can be decreased by trying some of the attached alternatives to traditional loaf bread   Avoid "Low fat dressings, as well as [place] dressings They are loaded with sugar!   3 PM/ Mid day  Snack:  Consider  1 ounce of  almonds, walnuts, pistachios, pecans, peanuts,  Macadamia nuts or a nut medley.  Avoid "granola and granola bars "  Mixed nuts are ok in moderation as long as there are no raisins,  cranberries or dried fruit.   KIND bars are OK if you get the low glycemic index variety   Try the prosciutto/mozzarella cheese sticks by Fiorruci  In deli /backery section   High protein      6 PM  Dinner:     Meat/fowl/fish with a green salad, and either broccoli, cauliflower, green beans, spinach, brussel sprouts or  Lima beans. DO NOT BREAD THE PROTEIN!!      There is a low carb pasta by Dreamfield's that is acceptable and tastes great: only 5 digestible carbs/serving.( All grocery stores but BJs carry it ) Several ready made meals are available low carb:   Try Michel Angelo's chicken piccata or chicken or eggplant parm over low carb pasta.(Lowes and BJs)   Marjory Lies Sanchez's "Carnitas" (pulled pork, no sauce,  0 carbs) or his beef pot roast to make a dinner burrito (at BJ's)  Pesto over low carb pasta (bj's sells a  good quality pesto in the center refrigerated section of the deli   Try satueeing  Cheral Marker with mushroooms as a good side   [employer] Giant makes a mashed cauliflower that tastes  like mashed potatoes  Whole wheat pasta is still full of digestible carbs and  Not as low in glycemic index as Dreamfield's.   Brown rice is still rice,  So skip the rice and noodles if you eat [place] or [place] (or at least limit to 1/2 cup)  9 PM snack :   Breyer's "low carb" fudgsicle or  ice cream bar (Carb Smart line), or  Weight Watcher's ice cream bar , or another "no sugar added" ice cream;  a serving of fresh berries/cherries with whipped cream   Cheese or DANNON'S LlGHT N FIT GREEK YOGURT  8 ounces of [place] unsweetened almond/cococunut milk    Treat yourself to a parfait made with whipped cream blueberiies, walnuts and vanilla greek yogurt  Avoid bananas, pineapple, grapes  and watermelon on a regular basis because they are high in sugar.  THINK OF THEM AS DESSERT  Remember that snack Substitutions should be less than 10 NET carbs per serving and meals < 20 carbs. Remember to subtract fiber grams to get the "net carbs."

## 2020-01-17 NOTE — Progress Notes (Signed)
Subjective:    Patient ID: Bernard Blake, male    DOB: May 16, 1970, 49 y.o.   MRN: [id number]  CC: Bernard Blake is a 49 y.o. male who presents today for physical exam and follow up chronic disease.     HPI: Feels well today. No complaints  During work day, eats out consistently including hot dog, donut. Enorses sweet tooth and nightly ice cream indiscretion.   HTN- compliant with lotensin. No cp, sob. Doesn't check blood pressure at home. NSAIDs daily.   Depression - feels well on regimen and is compliant with wellbutrin, citalopram. Endorses seasonal component to depression as he enters colder months and stressful holiday season at [employer]. He doesn't feel his regimen needs to be changed.  Stress with work as Optician, dispensing. No trouble sleeping.  No si/hi.   Right achilles tendon rupture and following with Dr Milinda Pointer. Planning surgery at some point when he can take off from work. Taking Mobic 15 mg daily.         Colorectal  Cancer Screening: scheduled tomorrow with Dr Bonna Gains Prostate Cancer Screening: States urology follows PSA. Follows with Dr Rogers Blocker. Has discussed decreased urinary flow with urologist. No pain with bowel movement.   Lung Cancer Screening: quit smoking in 1995.   No family history of AAA.  Immunizations       Tetanus - declines booster        Hepatitis C screening - Candidate for, consents  Labs: Screening labs today. 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 During day very physically active job with [employer].    Alcohol use:  Sober  11 years Smoking/tobacco use: former smoker.   Using smokeless tobacco Follows with [facility]   HISTORY:  Past Medical History:  Diagnosis Date  . Allergy   . Chicken pox   . Depression   . History of alcohol abuse   . Hypertension   . Low testosterone     Past Surgical History:  Procedure Laterality Date  . ANKLE SURGERY Left    BB removal   Family History    Problem Relation Age of Onset  . Arthritis Mother   . Hyperlipidemia Mother   . Hypertension Mother   . Arthritis Father   . Hyperlipidemia Father   . Hypertension Father   . Diabetes Father       ALLERGIES: Tetanus toxoids  Current Outpatient Medications on File Prior to Visit  Medication Sig Dispense Refill  . anastrozole (ARIMIDEX) 1 MG tablet SMARTSIG:0.5 Tablet(s) By Mouth Every Other Day    . Ascorbic Acid (VITAMIN C) 1000 MG tablet Take 2,000 mg by mouth daily.    . benazepril (LOTENSIN) 10 MG tablet TAKE 1 TABLET(10 MG) BY MOUTH DAILY 90 tablet 2  . buPROPion (WELLBUTRIN XL) 300 MG 24 hr tablet Take 1 tablet (300 mg total) by mouth daily. 90 tablet 1  . cholecalciferol (VITAMIN D) 1000 UNITS tablet Take 2,000 Units by mouth daily.    . citalopram (CELEXA) 20 MG tablet TAKE 2 TABLETS(40 MG) BY MOUTH DAILY 120 tablet 1  . Co-Enzyme Q-10 100 MG CAPS Take 100 mg by mouth.    Marland Kitchen JATENZO 237 MG CAPS Take 1 capsule by mouth 2 (two) times daily.    . meloxicam (MOBIC) 15 MG tablet TAKE 1 TABLET(15 MG) BY MOUTH DAILY 30 tablet 3  . Omega-3 Fatty Acids (FISH OIL) 1000 MG CAPS Take 2,400 mg by mouth once.    . selenium 200 MCG  TABS tablet Take 200 mcg by mouth daily.    . Zinc Sulfate (ZINC 15) 66 MG TABS Take 15 mg by mouth.     No current facility-administered medications on file prior to visit.    Social History   Tobacco Use  . Smoking status: Former Smoker    Packs/day: 1.00    Years: 8.00    Pack years: 8.00    Types: Cigarettes  . Smokeless tobacco: Current User    Types: Snuff  . Tobacco comment: Quit smoking in 1995.  Substance Use Topics  . Alcohol use: No    Alcohol/week: 0.0 standard drinks    Comment: Former alcoholic; now sober  . Drug use: No    Review of Systems  Constitutional: Negative for chills and fever.  HENT: Negative for congestion.   Respiratory: Negative for cough.   Cardiovascular: Negative for chest pain, palpitations and leg swelling.   Gastrointestinal: Negative for diarrhea, nausea and vomiting.  Genitourinary: Positive for difficulty urinating. Negative for decreased urine volume and hematuria.  Musculoskeletal: Positive for arthralgias (right foot). Negative for myalgias.  Skin: Negative for rash.  Neurological: Negative for headaches.  Hematological: Negative for adenopathy.  Psychiatric/Behavioral: Negative for confusion, sleep disturbance and suicidal ideas. The patient is not nervous/anxious.       Objective:    BP 130/82   Pulse 69   Temp 97.8 F (36.6 C)   Ht 6\' 3"  (1.905 m)   Wt 254 lb 6.4 oz (115.4 kg)   SpO2 96%   BMI 31.80 kg/m   BP Readings from Last 3 Encounters:  01/17/20 130/82  04/26/18 130/84  01/17/18 (!) 146/95   Wt Readings from Last 3 Encounters:  01/17/20 254 lb 6.4 oz (115.4 kg)  08/01/18 256 lb 4 oz (116.2 kg)  04/26/18 263 lb (119.3 kg)    Physical Exam Vitals reviewed.  Constitutional:      Appearance: He is well-developed.  HENT:     Head:     Comments:  Tongue and surrounding gum palpated, inspected without mass, lesions. Neck:     Thyroid: No thyroid mass or thyromegaly.  Cardiovascular:     Rate and Rhythm: Regular rhythm.     Heart sounds: Normal heart sounds.  Pulmonary:     Effort: Pulmonary effort is normal. No respiratory distress.     Breath sounds: Normal breath sounds. No wheezing, rhonchi or rales.  Lymphadenopathy:     Head:     Right side of head: No submental, submandibular, tonsillar, preauricular, posterior auricular or occipital adenopathy.     Left side of head: No submental, submandibular, tonsillar, preauricular, posterior auricular or occipital adenopathy.     Cervical: No cervical adenopathy.  Skin:    General: Skin is warm and dry.  Neurological:     Mental Status: He is alert.  Psychiatric:        Speech: Speech normal.        Behavior: Behavior normal.        Assessment & Plan:   Problem List Items Addressed This Visit       Cardiovascular and Mediastinum   Essential hypertension    Slightly elevated however he is taking mobic daily. Advised to take benazepril 10mg  at night to see if improves with close follow up.         Other   Depression    Overall controlled. Continue wellbutrin 300mg  and celexa 20mg . He declines making any changes . Close follow up.  Obesity (BMI 30.0-34.9)    Discussed dietary modification, eating out less and following low glycemic diet. Diet discussed and provided on avs today.       Routine physical examination - Primary    Colonoscopy tomorrow. Advised tdap vaccine and influenza in office today; patient declines. Advised to continue discuss difficultly urinating with Dr Rogers Blocker  Urology however we will draw PSA today as part of screening  Labs. Oral exam today in setting of smokeless tobacco and advised patient to continue to do with dentist, dermatology due to risk of oral cancer.       Relevant Orders   TSH   CBC with Differential/Platelet   Comprehensive metabolic panel   Hemoglobin A1c   Hepatitis C antibody   Lipid panel   PSA   VITAMIN D 25 Hydroxy (Vit-D Deficiency, Fractures)       I have discontinued Lydon Barto's albuterol, methylPREDNISolone, and Xyosted. I am also having him maintain his Fish Oil, vitamin C, cholecalciferol, Zinc Sulfate, Co-Enzyme Q-10, buPROPion, benazepril, meloxicam, citalopram, Jatenzo, anastrozole, and selenium.   No orders of the defined types were placed in this encounter.   Return precautions given.   Risks, benefits, and alternatives of the medications and treatment plan prescribed today were discussed, and patient expressed understanding.   Education regarding symptom management and diagnosis given to patient on AVS.   Continue to follow with [name], FNP for routine health maintenance.   Bernard Blake and I agreed with plan.   [name], FNP

## 2020-01-17 NOTE — Assessment & Plan Note (Signed)
Slightly elevated however he is taking mobic daily. Advised to take benazepril 10mg  at night to see if improves with close follow up.

## 2020-01-17 NOTE — Assessment & Plan Note (Addendum)
Colonoscopy tomorrow. Advised tdap vaccine and influenza in office today; patient declines. Advised to continue discuss difficultly urinating with Dr Rogers Blocker  Urology however we will draw PSA today as part of screening  Labs. Oral exam today in setting of smokeless tobacco and advised patient to continue to do with dentist, dermatology due to risk of oral cancer.

## 2020-01-18 ENCOUNTER — Encounter: Payer: Self-pay | Admitting: Gastroenterology

## 2020-01-18 ENCOUNTER — Ambulatory Visit: Payer: Federal, State, Local not specified - PPO | Admitting: Certified Registered"

## 2020-01-18 ENCOUNTER — Encounter
Admission: RE | Disposition: A | Discharge status: Home / Self Care | Payer: Self-pay | Source: Home / Self Care | Attending: Gastroenterology

## 2020-01-18 ENCOUNTER — Ambulatory Visit
Admission: RE | Admit: 2020-01-18 | Discharge: 2020-01-18 | Disposition: A | Discharge status: Home / Self Care | Payer: Federal, State, Local not specified - PPO | Attending: Gastroenterology | Admitting: Gastroenterology

## 2020-01-18 ENCOUNTER — Other Ambulatory Visit: Payer: Self-pay

## 2020-01-18 DIAGNOSIS — Z87891 Personal history of nicotine dependence: Secondary | ICD-10-CM | POA: Diagnosis not present

## 2020-01-18 DIAGNOSIS — I10 Essential (primary) hypertension: Secondary | ICD-10-CM | POA: Diagnosis not present

## 2020-01-18 DIAGNOSIS — K635 Polyp of colon: Secondary | ICD-10-CM

## 2020-01-18 DIAGNOSIS — Z833 Family history of diabetes mellitus: Secondary | ICD-10-CM | POA: Diagnosis not present

## 2020-01-18 DIAGNOSIS — Z8261 Family history of arthritis: Secondary | ICD-10-CM | POA: Diagnosis not present

## 2020-01-18 DIAGNOSIS — Z79899 Other long term (current) drug therapy: Secondary | ICD-10-CM | POA: Insufficient documentation

## 2020-01-18 DIAGNOSIS — Z1211 Encounter for screening for malignant neoplasm of colon: Secondary | ICD-10-CM

## 2020-01-18 DIAGNOSIS — Z887 Allergy status to serum and vaccine status: Secondary | ICD-10-CM | POA: Diagnosis not present

## 2020-01-18 DIAGNOSIS — D125 Benign neoplasm of sigmoid colon: Secondary | ICD-10-CM | POA: Diagnosis not present

## 2020-01-18 DIAGNOSIS — Z791 Long term (current) use of non-steroidal anti-inflammatories (NSAID): Secondary | ICD-10-CM | POA: Diagnosis not present

## 2020-01-18 DIAGNOSIS — Z8249 Family history of ischemic heart disease and other diseases of the circulatory system: Secondary | ICD-10-CM | POA: Diagnosis not present

## 2020-01-18 DIAGNOSIS — Z8349 Family history of other endocrine, nutritional and metabolic diseases: Secondary | ICD-10-CM | POA: Diagnosis not present

## 2020-01-18 DIAGNOSIS — K5790 Diverticulosis of intestine, part unspecified, without perforation or abscess without bleeding: Secondary | ICD-10-CM | POA: Diagnosis not present

## 2020-01-18 DIAGNOSIS — K5730 Diverticulosis of large intestine without perforation or abscess without bleeding: Secondary | ICD-10-CM | POA: Diagnosis not present

## 2020-01-18 HISTORY — PX: COLONOSCOPY WITH PROPOFOL: SHX5780

## 2020-01-18 LAB — HEPATITIS C ANTIBODY
Hepatitis C Ab: NONREACTIVE
SIGNAL TO CUT-OFF: 0.01 (ref <1.00)

## 2020-01-18 SURGERY — COLONOSCOPY WITH PROPOFOL
Anesthesia: General

## 2020-01-18 MED ORDER — PROPOFOL 500 MG/50ML IV EMUL
INTRAVENOUS | Status: AC
  Filled 2020-01-18: qty 50

## 2020-01-18 MED ORDER — LIDOCAINE 2% (20 MG/ML) 5 ML SYRINGE
INTRAMUSCULAR | Status: DC | PRN
  Administered 2020-01-18: 25 mg via INTRAVENOUS

## 2020-01-18 MED ORDER — PROPOFOL 500 MG/50ML IV EMUL
INTRAVENOUS | Status: DC | PRN
  Administered 2020-01-18: 120 ug/kg/min via INTRAVENOUS

## 2020-01-18 MED ORDER — FENTANYL CITRATE (PF) 100 MCG/2ML IJ SOLN
INTRAMUSCULAR | Status: AC
  Filled 2020-01-18: qty 2

## 2020-01-18 MED ORDER — PROPOFOL 10 MG/ML IV BOLUS
INTRAVENOUS | Status: AC
  Filled 2020-01-18: qty 20

## 2020-01-18 MED ORDER — MIDAZOLAM HCL 5 MG/5ML IJ SOLN
INTRAMUSCULAR | Status: DC | PRN
  Administered 2020-01-18: 2 mg via INTRAVENOUS

## 2020-01-18 MED ORDER — PROPOFOL 10 MG/ML IV BOLUS
INTRAVENOUS | Status: DC | PRN
  Administered 2020-01-18: 100 mg via INTRAVENOUS

## 2020-01-18 MED ORDER — SODIUM CHLORIDE 0.9 % IV SOLN: INTRAVENOUS | Status: DC

## 2020-01-18 MED ORDER — MIDAZOLAM HCL 2 MG/2ML IJ SOLN
INTRAMUSCULAR | Status: AC
  Filled 2020-01-18: qty 2

## 2020-01-18 MED ORDER — EPHEDRINE 5 MG/ML INJ
INTRAVENOUS | Status: AC
  Filled 2020-01-18: qty 10

## 2020-01-18 MED ORDER — FENTANYL CITRATE (PF) 100 MCG/2ML IJ SOLN
INTRAMUSCULAR | Status: DC | PRN
  Administered 2020-01-18 (×2): 50 ug via INTRAVENOUS

## 2020-01-18 NOTE — Op Note (Signed)
[facility] Gastroenterology Patient Name: Bernard Blake Procedure Date: 01/18/2020 10:19 AM MRN: [id number] Account #: [id number] Date of Birth: 10/20/1970 Admit Type: Outpatient Age: 49 Room: [facility] ENDO ROOM 2 Gender: Male Note Status: Finalized Procedure:             Colonoscopy Indications:           Screening for colorectal malignant neoplasm Providers:             Isley Weisheit B. Bonna Gains MD, MD Referring MD:          Yvetta Coder. Arnett (Referring MD) Medicines:             Monitored Anesthesia Care Complications:         No immediate complications. Procedure:             Pre-Anesthesia Assessment:                        - ASA Grade Assessment: II - A patient with mild                         systemic disease.                        - Prior to the procedure, a History and Physical was                         performed, and patient medications, allergies and                         sensitivities were reviewed. The patient's tolerance                         of previous anesthesia was reviewed.                        - The risks and benefits of the procedure and the                         sedation options and risks were discussed with the                         patient. All questions were answered and informed                         consent was obtained.                        - Patient identification and proposed procedure were                         verified prior to the procedure by the physician, the                         nurse, the anesthesiologist, the anesthetist and the                         technician. The procedure was verified in the                         procedure room.  After obtaining informed consent, the colonoscope was                         passed under direct vision. Throughout the procedure,                         the patient's blood pressure, pulse, and oxygen                         saturations were  monitored continuously. The                         Colonoscope was introduced through the anus and                         advanced to the the cecum, identified by appendiceal                         orifice and ileocecal valve. The colonoscopy was                         performed with ease. The patient tolerated the                         procedure well. The quality of the bowel preparation                         was fair. Findings:      The perianal and digital rectal examinations were normal.      A 4 mm polyp was found in the sigmoid colon. The polyp was sessile. The       polyp was removed with a jumbo cold forceps. Resection and retrieval       were complete.      Multiple diverticula were found in the sigmoid colon.      The exam was otherwise without abnormality.      The rectum, sigmoid colon, descending colon, transverse colon, ascending       colon and cecum appeared normal.      The retroflexed view of the distal rectum and anal verge was normal and       showed no anal or rectal abnormalities. Impression:            - Preparation of the colon was fair.                        - One 4 mm polyp in the sigmoid colon, removed with a                         jumbo cold forceps. Resected and retrieved.                        - Diverticulosis in the sigmoid colon.                        - The examination was otherwise normal.                        - The rectum, sigmoid colon, descending colon,  transverse colon, ascending colon and cecum are normal.                        - The distal rectum and anal verge are normal on                         retroflexion view. Recommendation:        - Discharge patient to home (with escort).                        - Advance diet as tolerated.                        - Continue present medications.                        - Await pathology results.                        - Repeat colonoscopy in 1 year, with 2 day prep.                         - The findings and recommendations were discussed with                         the patient.                        - The findings and recommendations were discussed with                         the patient's family.                        - Return to primary care physician as previously                         scheduled.                        - High fiber diet. Procedure Code(s):     --- Professional ---                        [phone], Colonoscopy, flexible; with biopsy, single or                         multiple Diagnosis Code(s):     --- Professional ---                        Z12.11, Encounter for screening for malignant neoplasm                         of colon                        K63.5, Polyp of colon CPT copyright 2019 American Medical Association. All rights reserved. The codes documented in this report are preliminary and upon coder review may  be revised to meet current compliance requirements.  [name], MD Margretta Sidle B. Bonna Gains MD, MD 01/18/2020 11:03:56 AM This report has been signed electronically.  Number of Addenda: 0 Note Initiated On: 01/18/2020 10:19 AM Scope Withdrawal Time: 0 hours 19 minutes 54 seconds  Total Procedure Duration: 0 hours 23 minutes 52 seconds  Estimated Blood Loss:  Estimated blood loss: none.      [facility]

## 2020-01-18 NOTE — Transfer of Care (Signed)
Immediate Anesthesia Transfer of Care Note  Patient: Bernard Blake  Procedure(s) Performed: COLONOSCOPY WITH PROPOFOL (N/A )  Patient Location: Endoscopy Unit  Anesthesia Type:General  Level of Consciousness: sedated  Airway & Oxygen Therapy: Patient Spontanous Breathing  Post-op Assessment: Report given to RN and Post -op Vital signs reviewed and stable  Post vital signs: Reviewed  Last Vitals:  Vitals Value Taken Time  BP 108/62 01/18/20 1106  Temp 36.4 C 01/18/20 1106  Pulse 66 01/18/20 1106  Resp 10 01/18/20 1106  SpO2 93 % 01/18/20 1106    Last Pain:  Vitals:   01/18/20 1106  TempSrc:   PainSc: Asleep         Complications: No complications documented.

## 2020-01-18 NOTE — Anesthesia Postprocedure Evaluation (Signed)
Anesthesia Post Note  Patient: [name]  Procedure(s) Performed: COLONOSCOPY WITH PROPOFOL (N/A )  Patient location during evaluation: Endoscopy Anesthesia Type: General Level of consciousness: awake and alert and oriented Pain management: pain level controlled Vital Signs Assessment: post-procedure vital signs reviewed and stable Respiratory status: spontaneous breathing, nonlabored ventilation and respiratory function stable Cardiovascular status: blood pressure returned to baseline and stable Postop Assessment: no signs of nausea or vomiting Anesthetic complications: no   No complications documented.   Last Vitals:  Vitals:   01/18/20 1126 01/18/20 1136  BP: 129/84 125/83  Pulse: 67 (!) 50  Resp: 15 13  Temp:    SpO2: 96% 99%    Last Pain:  Vitals:   01/18/20 1136  TempSrc:   PainSc: 0-No pain                 Ferdinando Lodge

## 2020-01-18 NOTE — Anesthesia Preprocedure Evaluation (Signed)
Anesthesia Evaluation  Patient identified by MRN, date of birth, ID band Patient awake    Reviewed: Allergy & Precautions, NPO status   History of Anesthesia Complications Negative for: history of anesthetic complications  Airway Mallampati: II  TM Distance: >3 FB Neck ROM: Full    Dental no notable dental hx.    Pulmonary neg sleep apnea, neg COPD, former smoker,    breath sounds clear to auscultation- rhonchi (-) wheezing      Cardiovascular hypertension, Pt. on medications (-) CAD, (-) Past MI, (-) Cardiac Stents and (-) CABG  Rhythm:Regular Rate:Normal - Systolic murmurs and - Diastolic murmurs    Neuro/Psych neg Seizures PSYCHIATRIC DISORDERS Depression negative neurological ROS     GI/Hepatic negative GI ROS, Neg liver ROS,   Endo/Other  negative endocrine ROSneg diabetes  Renal/GU negative Renal ROS     Musculoskeletal negative musculoskeletal ROS (+)   Abdominal (+) + obese,   Peds  Hematology negative hematology ROS (+)   Anesthesia Other Findings Past Medical History: No date: Allergy No date: Chicken pox No date: Depression No date: History of alcohol abuse No date: Hypertension No date: Low testosterone   Reproductive/Obstetrics                             Anesthesia Physical Anesthesia Plan  ASA: II  Anesthesia Plan: General   Post-op Pain Management:    Induction: Intravenous  PONV Risk Score and Plan: 1 and Propofol infusion  Airway Management Planned: Natural Airway  Additional Equipment:   Intra-op Plan:   Post-operative Plan:   Informed Consent: I have reviewed the patients History and Physical, chart, labs and discussed the procedure including the risks, benefits and alternatives for the proposed anesthesia with the patient or authorized representative who has indicated his/her understanding and acceptance.     Dental advisory given  Plan  Discussed with: CRNA and Anesthesiologist  Anesthesia Plan Comments:         Anesthesia Quick Evaluation

## 2020-01-18 NOTE — OR Nursing (Signed)
Pt drank gator aide last at 0800, pt postponed to 10:00 am per Dr. Morrison Old.

## 2020-01-18 NOTE — H&P (Signed)
[name], MD [address], [place], [place], [place], [address] [address], [place], [place], [place], [address] Phone: [phone]  Fax: [phone]  Primary Care Physician:  [name], FNP   Pre-Procedure History & Physical: HPI:  Bernard Blake is a 49 y.o. male is here for a colonoscopy.   Past Medical History:  Diagnosis Date  . Allergy   . Chicken pox   . Depression   . History of alcohol abuse   . Hypertension   . Low testosterone     Past Surgical History:  Procedure Laterality Date  . ANKLE SURGERY Left    BB removal    Prior to Admission medications   Medication Sig Start Date End Date Taking? Authorizing Provider  Ascorbic Acid (VITAMIN C) 1000 MG tablet Take 2,000 mg by mouth daily.   Yes [provider]  benazepril (LOTENSIN) 10 MG tablet TAKE 1 TABLET(10 MG) BY MOUTH DAILY 06/19/19  Yes [name], FNP  buPROPion (WELLBUTRIN XL) 300 MG 24 hr tablet Take 1 tablet (300 mg total) by mouth daily. 02/21/19  Yes [name], FNP  cholecalciferol (VITAMIN D) 1000 UNITS tablet Take 2,000 Units by mouth daily.   Yes [provider]  citalopram (CELEXA) 20 MG tablet TAKE 2 TABLETS(40 MG) BY MOUTH DAILY 12/31/19  Yes Arnett, Yvetta Coder, FNP  Co-Enzyme Q-10 100 MG CAPS Take 100 mg by mouth.   Yes [provider]  JATENZO 237 MG CAPS Take 1 capsule by mouth 2 (two) times daily. 01/12/20  Yes [provider]  Omega-3 Fatty Acids (FISH OIL) 1000 MG CAPS Take 2,400 mg by mouth once.   Yes [provider]  selenium 200 MCG TABS tablet Take 200 mcg by mouth daily.   Yes [provider]  anastrozole (ARIMIDEX) 1 MG tablet SMARTSIG:0.5 Tablet(s) By Mouth Every Other Day 12/14/19   [provider]  meloxicam (MOBIC) 15 MG tablet TAKE 1 TABLET(15 MG) BY MOUTH DAILY 11/12/19   Hyatt, Max T, DPM  Zinc Sulfate (ZINC 15) 66 MG TABS Take 15 mg by mouth.    [provider]     Allergies as of 01/15/2020 - Review Complete 01/14/2020  Allergen Reaction Noted  . Tetanus toxoids Swelling 03/12/2015    Family History  Problem Relation Age of Onset  . Arthritis Mother   . Hyperlipidemia Mother   . Hypertension Mother   . Arthritis Father   . Hyperlipidemia Father   . Hypertension Father   . Diabetes Father     Social History   Socioeconomic History  . Marital status: Married    Spouse name: Not on file  . Number of children: Not on file  . Years of education: Not on file  . Highest education level: Not on file  Occupational History  . Not on file  Tobacco Use  . Smoking status: Former Smoker    Packs/day: 1.00    Years: 8.00    Pack years: 8.00    Types: Cigarettes  . Smokeless tobacco: Current User    Types: Snuff  . Tobacco comment: Quit smoking in 1995.  Substance and Sexual Activity  . Alcohol use: No    Alcohol/week: 0.0 standard drinks    Comment: Former alcoholic; now sober  . Drug use: No  . Sexual activity: Yes  Other Topics Concern  . Not on file  Social History Narrative  . Not on file   Social Determinants of Health   Financial  Resource Strain:   . Difficulty of Paying Living Expenses: Not on file  Food Insecurity:   . Worried About Charity fundraiser in the Last Year: Not on file  . Ran Out of Food in the Last Year: Not on file  Transportation Needs:   . Lack of Transportation (Medical): Not on file  . Lack of Transportation (Non-Medical): Not on file  Physical Activity:   . Days of Exercise per Week: Not on file  . Minutes of Exercise per Session: Not on file  Stress:   . Feeling of Stress : Not on file  Social Connections:   . Frequency of Communication with Friends and Family: Not on file  . Frequency of Social Gatherings with Friends and Family: Not on file  . Attends Religious Services: Not on file  . Active Member of Clubs or Organizations: Not on file  . Attends Archivist Meetings: Not on  file  . Marital Status: Not on file  Intimate Partner Violence:   . Fear of Current or Ex-Partner: Not on file  . Emotionally Abused: Not on file  . Physically Abused: Not on file  . Sexually Abused: Not on file    Review of Systems: See HPI, otherwise negative ROS  Physical Exam: BP 139/81   Pulse 64   Temp (!) 96.7 F (35.9 C) (Temporal)   Resp 16   Ht 6\' 3"  (1.905 m)   Wt 115.7 kg   SpO2 98%   BMI 31.87 kg/m  General:   Alert,  pleasant and cooperative in NAD Head:  Normocephalic and atraumatic. Neck:  Supple; no masses or thyromegaly. Lungs:  Clear throughout to auscultation, normal respiratory effort.    Heart:  +S1, +S2, Regular rate and rhythm, No edema. Abdomen:  Soft, nontender and nondistended. Normal bowel sounds, without guarding, and without rebound.   Neurologic:  Alert and  oriented x4;  grossly normal neurologically.  Impression/Plan: Bernard Blake is here for a colonoscopy to be performed for average risk screening.  Risks, benefits, limitations, and alternatives regarding  colonoscopy have been reviewed with the patient.  Questions have been answered.  All parties agreeable.   Virgel Manifold, MD  01/18/2020, 10:23 AM

## 2020-01-21 ENCOUNTER — Encounter: Payer: Self-pay | Admitting: Gastroenterology

## 2020-01-21 LAB — SURGICAL PATHOLOGY

## 2020-01-22 ENCOUNTER — Encounter: Payer: Self-pay | Admitting: Gastroenterology

## 2020-01-30 ENCOUNTER — Encounter: Payer: Self-pay | Admitting: Podiatry

## 2020-01-30 ENCOUNTER — Telehealth: Payer: Self-pay

## 2020-01-30 NOTE — Telephone Encounter (Signed)
Received surgery paperwork for the [place] office. I left a message for Bernard Blake to call me so we can get his surgery scheduled.

## 2020-01-31 DIAGNOSIS — M9903 Segmental and somatic dysfunction of lumbar region: Secondary | ICD-10-CM | POA: Diagnosis not present

## 2020-01-31 DIAGNOSIS — M5136 Other intervertebral disc degeneration, lumbar region: Secondary | ICD-10-CM | POA: Diagnosis not present

## 2020-01-31 DIAGNOSIS — M62830 Muscle spasm of back: Secondary | ICD-10-CM | POA: Diagnosis not present

## 2020-01-31 DIAGNOSIS — M5416 Radiculopathy, lumbar region: Secondary | ICD-10-CM | POA: Diagnosis not present

## 2020-02-01 ENCOUNTER — Encounter: Payer: Self-pay | Admitting: Podiatry

## 2020-02-04 DIAGNOSIS — M5416 Radiculopathy, lumbar region: Secondary | ICD-10-CM | POA: Diagnosis not present

## 2020-02-04 DIAGNOSIS — M62830 Muscle spasm of back: Secondary | ICD-10-CM | POA: Diagnosis not present

## 2020-02-04 DIAGNOSIS — M9903 Segmental and somatic dysfunction of lumbar region: Secondary | ICD-10-CM | POA: Diagnosis not present

## 2020-02-04 DIAGNOSIS — M5136 Other intervertebral disc degeneration, lumbar region: Secondary | ICD-10-CM | POA: Diagnosis not present

## 2020-02-11 ENCOUNTER — Other Ambulatory Visit: Payer: Self-pay | Admitting: Family

## 2020-02-11 DIAGNOSIS — F32A Depression, unspecified: Secondary | ICD-10-CM

## 2020-02-19 DIAGNOSIS — M5416 Radiculopathy, lumbar region: Secondary | ICD-10-CM | POA: Diagnosis not present

## 2020-02-19 DIAGNOSIS — M5136 Other intervertebral disc degeneration, lumbar region: Secondary | ICD-10-CM | POA: Diagnosis not present

## 2020-02-19 DIAGNOSIS — M62830 Muscle spasm of back: Secondary | ICD-10-CM | POA: Diagnosis not present

## 2020-02-19 DIAGNOSIS — M9903 Segmental and somatic dysfunction of lumbar region: Secondary | ICD-10-CM | POA: Diagnosis not present

## 2020-02-22 ENCOUNTER — Telehealth: Payer: Self-pay | Admitting: Podiatry

## 2020-02-22 NOTE — Telephone Encounter (Signed)
DOS: 03/07/2020  Procedures: Endoscopic Plantar Fasciotomy Rt (20910), Achilles Tendon  Repair Rt (68166), Gastrocnemius Recess Rt (19694), Calcaneal Ostectomy (Posterior) Rt (09828).  Dr. Jacqualyn Posey is assisting in Surgery.  BCBS Effective From: 04/02/2000 - 03/21/9998  Deductible: $0  Out of Pocket: $5,500 with $5,380 met and $120 remaining. CoInsurance: 30% Copay: $0  Per [employer] no Prior Authorization is required.

## 2020-02-28 ENCOUNTER — Other Ambulatory Visit: Payer: Self-pay

## 2020-03-03 ENCOUNTER — Ambulatory Visit (INDEPENDENT_AMBULATORY_CARE_PROVIDER_SITE_OTHER): Payer: Federal, State, Local not specified - PPO | Admitting: Family

## 2020-03-03 ENCOUNTER — Encounter: Payer: Self-pay | Admitting: Family

## 2020-03-03 ENCOUNTER — Other Ambulatory Visit: Payer: Self-pay

## 2020-03-03 VITALS — BP 126/84 | HR 83 | Temp 97.5°F | Ht 75.0 in | Wt 254.4 lb

## 2020-03-03 DIAGNOSIS — F32A Depression, unspecified: Secondary | ICD-10-CM | POA: Diagnosis not present

## 2020-03-03 DIAGNOSIS — I10 Essential (primary) hypertension: Secondary | ICD-10-CM

## 2020-03-03 DIAGNOSIS — R7303 Prediabetes: Secondary | ICD-10-CM

## 2020-03-03 NOTE — Assessment & Plan Note (Signed)
Stable. Continue celexa 20mg  and wellbutrin 300mg 

## 2020-03-03 NOTE — Progress Notes (Signed)
Subjective:    Patient ID: Bernard Blake, male    DOB: January 05, 1971, 49 y.o.   MRN: [id number]  CC: Bernard Blake is a 49 y.o. male who presents today for follow up.   HPI: Feels well today No new complaints.   Depression-feels well on celexa and wellbutrin. He expects mood to improve with upcoming surgery and being of work for 3 months.   HTN- compliant with benazepril 10mg . No cp.   H/o prediabetes 6.3  Planned plantar fasciotomy right with dr Milinda Pointer in 5 days    HISTORY:  Past Medical History:  Diagnosis Date  . Allergy   . Chicken pox   . Depression   . History of alcohol abuse   . Hypertension   . Low testosterone    Past Surgical History:  Procedure Laterality Date  . ANKLE SURGERY Left    BB removal  . COLONOSCOPY WITH PROPOFOL N/A 01/18/2020   Procedure: COLONOSCOPY WITH PROPOFOL;  Surgeon: Virgel Manifold, MD;  Location: [facility] ENDOSCOPY;  Service: Endoscopy;  Laterality: N/A;   Family History  Problem Relation Age of Onset  . Arthritis Mother   . Hyperlipidemia Mother   . Hypertension Mother   . Arthritis Father   . Hyperlipidemia Father   . Hypertension Father   . Diabetes Father     Allergies: Tetanus toxoids Current Outpatient Medications on File Prior to Visit  Medication Sig Dispense Refill  . anastrozole (ARIMIDEX) 1 MG tablet SMARTSIG:0.5 Tablet(s) By Mouth Every Other Day    . Ascorbic Acid (VITAMIN C) 1000 MG tablet Take 2,000 mg by mouth daily.    . benazepril (LOTENSIN) 10 MG tablet TAKE 1 TABLET(10 MG) BY MOUTH DAILY 90 tablet 2  . buPROPion (WELLBUTRIN XL) 300 MG 24 hr tablet TAKE 1 TABLET(300 MG) BY MOUTH DAILY 90 tablet 1  . cholecalciferol (VITAMIN D) 1000 UNITS tablet Take 2,000 Units by mouth daily.    . citalopram (CELEXA) 20 MG tablet TAKE 2 TABLETS(40 MG) BY MOUTH DAILY 120 tablet 1  . Co-Enzyme Q-10 100 MG CAPS Take 100 mg by mouth.    Marland Kitchen JATENZO 237 MG CAPS Take 1 capsule by mouth 2 (two) times daily.    . meloxicam (MOBIC)  15 MG tablet TAKE 1 TABLET(15 MG) BY MOUTH DAILY 30 tablet 3  . Omega-3 Fatty Acids (FISH OIL) 1000 MG CAPS Take 2,400 mg by mouth once.    . selenium 200 MCG TABS tablet Take 200 mcg by mouth daily.    . Zinc Sulfate 66 MG TABS Take 15 mg by mouth.     No current facility-administered medications on file prior to visit.    Social History   Tobacco Use  . Smoking status: Former Smoker    Packs/day: 1.00    Years: 8.00    Pack years: 8.00    Types: Cigarettes  . Smokeless tobacco: Current User    Types: Snuff  . Tobacco comment: Quit smoking in 1995.  Substance Use Topics  . Alcohol use: No    Alcohol/week: 0.0 standard drinks    Comment: Former alcoholic; now sober  . Drug use: No    Review of Systems  Constitutional: Negative for chills and fever.  Respiratory: Negative for cough.   Cardiovascular: Negative for chest pain and palpitations.  Gastrointestinal: Negative for nausea and vomiting.      Objective:    BP 126/84   Pulse 83   Temp (!) 97.5 F (36.4 C)   Ht 6'  3" (1.905 m)   Wt 254 lb 6.4 oz (115.4 kg)   SpO2 97%   BMI 31.80 kg/m  BP Readings from Last 3 Encounters:  03/03/20 126/84  01/18/20 125/83  01/17/20 130/82   Wt Readings from Last 3 Encounters:  03/03/20 254 lb 6.4 oz (115.4 kg)  01/18/20 255 lb (115.7 kg)  01/17/20 254 lb 6.4 oz (115.4 kg)    Physical Exam Vitals reviewed.  Constitutional:      Appearance: He is well-developed and well-nourished.  Cardiovascular:     Rate and Rhythm: Regular rhythm.     Heart sounds: Normal heart sounds.  Pulmonary:     Effort: Pulmonary effort is normal. No respiratory distress.     Breath sounds: Normal breath sounds. No wheezing, rhonchi or rales.  Skin:    General: Skin is warm and dry.  Neurological:     Mental Status: He is alert.  Psychiatric:        Mood and Affect: Mood and affect normal.        Speech: Speech normal.        Behavior: Behavior normal.        Assessment & Plan:    Problem List Items Addressed This Visit      Cardiovascular and Mediastinum   Essential hypertension    Controlled. Continue benazepril 10mg .        Other   Depression    Stable. Continue celexa 20mg  and wellbutrin 300mg        Other Visit Diagnoses    Prediabetes    -  Primary   Relevant Orders   Hemoglobin A1c       I am having Bernard Blake maintain his Fish Oil, vitamin C, cholecalciferol, Zinc Sulfate, Co-Enzyme Q-10, benazepril, meloxicam, citalopram, Jatenzo, anastrozole, selenium, and buPROPion.   No orders of the defined types were placed in this encounter.   Return precautions given.   Risks, benefits, and alternatives of the medications and treatment plan prescribed today were discussed, and patient expressed understanding.   Education regarding symptom management and diagnosis given to patient on AVS.  Continue to follow with [name], FNP for routine health maintenance.   Bernard Blake and I agreed with plan.   [name], FNP

## 2020-03-03 NOTE — Patient Instructions (Signed)
a1c lab in 8 weeks.   Nice to see you!

## 2020-03-03 NOTE — Assessment & Plan Note (Signed)
Controlled. Continue benazepril 10mg .

## 2020-03-04 ENCOUNTER — Other Ambulatory Visit: Payer: Self-pay | Admitting: Podiatry

## 2020-03-04 NOTE — Telephone Encounter (Signed)
Please advise 

## 2020-03-05 ENCOUNTER — Telehealth: Payer: Self-pay | Admitting: Family

## 2020-03-05 NOTE — Telephone Encounter (Signed)
close

## 2020-03-06 ENCOUNTER — Other Ambulatory Visit: Payer: Self-pay | Admitting: Podiatry

## 2020-03-06 MED ORDER — OXYCODONE-ACETAMINOPHEN 10-325 MG PO TABS
1.0000 | ORAL_TABLET | Freq: Three times a day (TID) | ORAL | 0 refills | Status: AC | PRN
Start: 2020-03-06 — End: 2020-03-13

## 2020-03-06 MED ORDER — ONDANSETRON HCL 4 MG PO TABS: 4.0000 mg | ORAL_TABLET | Freq: Three times a day (TID) | ORAL | 0 refills | Status: DC | PRN

## 2020-03-06 MED ORDER — CEPHALEXIN 500 MG PO CAPS: 500.0000 mg | ORAL_CAPSULE | Freq: Three times a day (TID) | ORAL | 0 refills | Status: DC

## 2020-03-07 DIAGNOSIS — M7731 Calcaneal spur, right foot: Secondary | ICD-10-CM | POA: Diagnosis not present

## 2020-03-07 DIAGNOSIS — S86011A Strain of right Achilles tendon, initial encounter: Secondary | ICD-10-CM | POA: Diagnosis not present

## 2020-03-07 DIAGNOSIS — S86011D Strain of right Achilles tendon, subsequent encounter: Secondary | ICD-10-CM | POA: Diagnosis not present

## 2020-03-07 DIAGNOSIS — M25571 Pain in right ankle and joints of right foot: Secondary | ICD-10-CM | POA: Diagnosis not present

## 2020-03-07 DIAGNOSIS — M722 Plantar fascial fibromatosis: Secondary | ICD-10-CM | POA: Diagnosis not present

## 2020-03-07 DIAGNOSIS — X58XXXA Exposure to other specified factors, initial encounter: Secondary | ICD-10-CM | POA: Diagnosis not present

## 2020-03-07 DIAGNOSIS — M67873 Other specified disorders of tendon, right ankle and foot: Secondary | ICD-10-CM | POA: Diagnosis not present

## 2020-03-07 DIAGNOSIS — M216X1 Other acquired deformities of right foot: Secondary | ICD-10-CM | POA: Diagnosis not present

## 2020-03-07 DIAGNOSIS — Y929 Unspecified place or not applicable: Secondary | ICD-10-CM | POA: Diagnosis not present

## 2020-03-11 ENCOUNTER — Telehealth: Payer: Self-pay

## 2020-03-11 DIAGNOSIS — M79676 Pain in unspecified toe(s): Secondary | ICD-10-CM

## 2020-03-11 NOTE — Telephone Encounter (Signed)
Spoke pt today concerning after surgery issues. Pt had a fever 103 Sunday night and yesterday 101.9. Pt hasn't had fever today. I did inform the patient that he would need to be fever free 48 hours before being seen tomorrow.  I did let him know to get a COVID test if he sikes a fever tonight and call the office to cancelled his appointment. Overall pt hasn't had any issues with his surgery other than the fever episode.

## 2020-03-12 ENCOUNTER — Encounter: Payer: Self-pay | Admitting: Podiatry

## 2020-03-12 ENCOUNTER — Other Ambulatory Visit: Payer: Self-pay

## 2020-03-12 ENCOUNTER — Ambulatory Visit (INDEPENDENT_AMBULATORY_CARE_PROVIDER_SITE_OTHER): Payer: Federal, State, Local not specified - PPO | Admitting: Podiatry

## 2020-03-12 ENCOUNTER — Ambulatory Visit (INDEPENDENT_AMBULATORY_CARE_PROVIDER_SITE_OTHER): Payer: Federal, State, Local not specified - PPO

## 2020-03-12 DIAGNOSIS — M722 Plantar fascial fibromatosis: Secondary | ICD-10-CM

## 2020-03-12 DIAGNOSIS — S86011D Strain of right Achilles tendon, subsequent encounter: Secondary | ICD-10-CM

## 2020-03-12 DIAGNOSIS — M7731 Calcaneal spur, right foot: Secondary | ICD-10-CM

## 2020-03-12 DIAGNOSIS — Z9889 Other specified postprocedural states: Secondary | ICD-10-CM

## 2020-03-12 NOTE — Progress Notes (Signed)
He presents today first week postop date of surgery 03/07/2020 endoscopic plantar fasciotomy with a repair of his Achilles tendon retrocalcaneal spur resection.  States that he is doing quite well ran a couple of days ago really high fever and has since done much better.  He states that his leg feels great no problems with that.  Objective: Vital signs are stable he is alert and oriented x3 presents today with cast on it appears to be clean just slightly dirty on the bottom toes are intact no erythema edema cellulitis drainage or odor is loose proximally.  He has good sensation to his toes.  Radiographs taken today demonstrate swelling of the retrocalcaneal space.  He also has swelling in the retrosubtalar joint area.  Achilles is intact.  Assessment well-healing surgical foot.  Plan: Notify us with any further increases of temperature.  Chest pain shortness of breath.  Otherwise I will follow-up with him in 1 week for cast removal and reapplication.

## 2020-03-13 ENCOUNTER — Encounter: Payer: Self-pay | Admitting: Podiatry

## 2020-03-17 DIAGNOSIS — N401 Enlarged prostate with lower urinary tract symptoms: Secondary | ICD-10-CM | POA: Diagnosis not present

## 2020-03-17 DIAGNOSIS — Z79899 Other long term (current) drug therapy: Secondary | ICD-10-CM | POA: Diagnosis not present

## 2020-03-17 DIAGNOSIS — N5201 Erectile dysfunction due to arterial insufficiency: Secondary | ICD-10-CM | POA: Diagnosis not present

## 2020-03-17 DIAGNOSIS — E291 Testicular hypofunction: Secondary | ICD-10-CM | POA: Diagnosis not present

## 2020-03-19 ENCOUNTER — Other Ambulatory Visit: Payer: Self-pay

## 2020-03-19 ENCOUNTER — Encounter: Payer: Self-pay | Admitting: Podiatry

## 2020-03-19 ENCOUNTER — Ambulatory Visit (INDEPENDENT_AMBULATORY_CARE_PROVIDER_SITE_OTHER): Payer: Federal, State, Local not specified - PPO | Admitting: Podiatry

## 2020-03-19 DIAGNOSIS — S86011D Strain of right Achilles tendon, subsequent encounter: Secondary | ICD-10-CM | POA: Diagnosis not present

## 2020-03-19 DIAGNOSIS — Z9889 Other specified postprocedural states: Secondary | ICD-10-CM

## 2020-03-19 DIAGNOSIS — M722 Plantar fascial fibromatosis: Secondary | ICD-10-CM

## 2020-03-19 DIAGNOSIS — M7731 Calcaneal spur, right foot: Secondary | ICD-10-CM

## 2020-03-19 NOTE — Progress Notes (Signed)
He presents today for follow-up of his Achilles tendon repair with an endoscopic fasciotomy and cast.  He denies fever chills nausea vomiting muscle aches and pains does state sometimes of diaphoresis but very little pain as far as the surgical site goes.  Continues to crutches and his knee scooter.  Objective: Vital signs are stable he is alert and oriented x3.  Pulses are palpable.  Plantar aspect of the cast is slightly dirty more than likely produce just been touching the toe to the floor while standing.  Does not appear to be broken down.  Once the cast was removed dry sterile dressing intact once removed demonstrates no erythema cellulitis drainage or odor staples are intact sutures are intact margins are well coapted.  Good dorsiflexion plantarflexion states is a bit tight proximally but other than that is doing well.  Assessment: Well-healing surgical foot no signs of infection.  Plan: Redressed today dressed a compressive dressing placed him back in the below-knee fiberglass cast I will follow-up with him in 2 weeks for staple and suture removal.

## 2020-03-27 ENCOUNTER — Other Ambulatory Visit: Payer: Self-pay | Admitting: Family

## 2020-03-27 DIAGNOSIS — M5136 Other intervertebral disc degeneration, lumbar region: Secondary | ICD-10-CM | POA: Diagnosis not present

## 2020-03-27 DIAGNOSIS — M9903 Segmental and somatic dysfunction of lumbar region: Secondary | ICD-10-CM | POA: Diagnosis not present

## 2020-03-27 DIAGNOSIS — I10 Essential (primary) hypertension: Secondary | ICD-10-CM

## 2020-03-27 DIAGNOSIS — M5416 Radiculopathy, lumbar region: Secondary | ICD-10-CM | POA: Diagnosis not present

## 2020-03-27 DIAGNOSIS — M62830 Muscle spasm of back: Secondary | ICD-10-CM | POA: Diagnosis not present

## 2020-04-02 ENCOUNTER — Ambulatory Visit (INDEPENDENT_AMBULATORY_CARE_PROVIDER_SITE_OTHER): Payer: Federal, State, Local not specified - PPO | Admitting: Podiatry

## 2020-04-02 ENCOUNTER — Other Ambulatory Visit: Payer: Self-pay

## 2020-04-02 DIAGNOSIS — S86011D Strain of right Achilles tendon, subsequent encounter: Secondary | ICD-10-CM | POA: Diagnosis not present

## 2020-04-02 DIAGNOSIS — Z9889 Other specified postprocedural states: Secondary | ICD-10-CM

## 2020-04-02 NOTE — Progress Notes (Signed)
He presents today for his third postop visit date of surgery 03/07/2020 status post repair of a ruptured Achilles tendon with retrocalcaneal heel spur resection and an endoscopic fasciotomy.  He denies fever chills nausea vomiting muscle aches pains states that he is doing quite well.  Denies chest pain shortness of breath.  Objective: Cast was intact once removed demonstrates dry sterile dressing intact once removed demonstrates staples are intact margins well coapted staples Staples were removed margins remain well coapted he has good plantar flexion against resistance mildly tender at maximum plantarflexion.  No drainage no purulence no erythema cellulitis drainage or odor.  Assessment: Well-healing surgical foot leg.  Plan: I am going to allow him to start getting this wet and I am encouraging to do range of motion exercises Epson salts and warm water soaks as well as icing.  He is to stay in the boot 24/7 otherwise.  He understands this and is amenable to it.  This was concurrently continue nonweightbearing stance I will follow-up with him in 2 weeks and progress to partial weightbearing.

## 2020-04-16 ENCOUNTER — Ambulatory Visit (INDEPENDENT_AMBULATORY_CARE_PROVIDER_SITE_OTHER): Payer: Federal, State, Local not specified - PPO | Admitting: Podiatry

## 2020-04-16 ENCOUNTER — Encounter: Payer: Self-pay | Admitting: Podiatry

## 2020-04-16 ENCOUNTER — Other Ambulatory Visit: Payer: Self-pay

## 2020-04-16 DIAGNOSIS — M722 Plantar fascial fibromatosis: Secondary | ICD-10-CM

## 2020-04-16 DIAGNOSIS — M7731 Calcaneal spur, right foot: Secondary | ICD-10-CM

## 2020-04-16 DIAGNOSIS — S86011D Strain of right Achilles tendon, subsequent encounter: Secondary | ICD-10-CM

## 2020-04-16 DIAGNOSIS — Z9889 Other specified postprocedural states: Secondary | ICD-10-CM

## 2020-04-16 NOTE — Progress Notes (Signed)
He presents today date of surgery 03/07/2020 right foot primary repair of his Achilles tendon.  Heel spur resection and an endoscopic fasciotomy with a cast was also performed.  He states that on doing better have no pain whatsoever.  Objective: Vital signs are stable he is alert and oriented x3.  There is no erythema edema cellulitis drainage or odor he has good dorsiflexion and plantar flexion against resistance.  He has a small distal scab to the incision site but it is no purulence no malodor no signs of infection.  Assessment: Well-healing surgical foot and leg.  Plan: I am going to request that he remain in his cam boot for the next 2 weeks or so and he will start partial weightbearing we will move own after 2 to 3 weeks of partial weightbearing to tennis shoes with his Tri-Lock brace which we will dispense at that time.

## 2020-04-24 DIAGNOSIS — M5416 Radiculopathy, lumbar region: Secondary | ICD-10-CM | POA: Diagnosis not present

## 2020-04-24 DIAGNOSIS — M62830 Muscle spasm of back: Secondary | ICD-10-CM | POA: Diagnosis not present

## 2020-04-24 DIAGNOSIS — M5136 Other intervertebral disc degeneration, lumbar region: Secondary | ICD-10-CM | POA: Diagnosis not present

## 2020-04-24 DIAGNOSIS — M9903 Segmental and somatic dysfunction of lumbar region: Secondary | ICD-10-CM | POA: Diagnosis not present

## 2020-04-28 ENCOUNTER — Other Ambulatory Visit: Payer: Federal, State, Local not specified - PPO

## 2020-04-29 ENCOUNTER — Other Ambulatory Visit (INDEPENDENT_AMBULATORY_CARE_PROVIDER_SITE_OTHER): Payer: Federal, State, Local not specified - PPO

## 2020-04-29 ENCOUNTER — Other Ambulatory Visit: Payer: Self-pay

## 2020-04-29 DIAGNOSIS — R7303 Prediabetes: Secondary | ICD-10-CM | POA: Diagnosis not present

## 2020-04-29 LAB — HEMOGLOBIN A1C: Hgb A1c MFr Bld: 5.7 % (ref 4.6–6.5)

## 2020-05-07 ENCOUNTER — Ambulatory Visit (INDEPENDENT_AMBULATORY_CARE_PROVIDER_SITE_OTHER): Payer: Federal, State, Local not specified - PPO | Admitting: Podiatry

## 2020-05-07 ENCOUNTER — Encounter: Payer: Self-pay | Admitting: Podiatry

## 2020-05-07 ENCOUNTER — Other Ambulatory Visit: Payer: Self-pay

## 2020-05-07 DIAGNOSIS — M722 Plantar fascial fibromatosis: Secondary | ICD-10-CM

## 2020-05-07 DIAGNOSIS — S86011D Strain of right Achilles tendon, subsequent encounter: Secondary | ICD-10-CM | POA: Diagnosis not present

## 2020-05-07 DIAGNOSIS — Z9889 Other specified postprocedural states: Secondary | ICD-10-CM

## 2020-05-07 DIAGNOSIS — M7731 Calcaneal spur, right foot: Secondary | ICD-10-CM

## 2020-05-07 NOTE — Progress Notes (Signed)
He presents today date of surgery 03/07/2020 2 months out at this point. He is a Chief Financial Officer of Achilles tendon. States that he started to develop some pain and swelling in the Achilles area. Majority of his symptoms are plantar heel and the lateral aspect of the foot. He still continues to use the crutches in the cam boot.  Objective: Vital signs are stable he is alert oriented x3 there is no erythema just mild edema at the surgical site still has a small scab distally. Has good plantar flexion against resistance with no pain. Only pain is on palpation of the surgical sites.  Assessment well-healing surgical foot and ankle right.  Plan: At this point unguinal recommend physical therapy to help strengthen however they need to take this very slowly and I explained this to him. He understands that and is amendable to it. We will continue use of the cam walker but without the crutches at this point. I will see him back in 3 to 4 weeks at which time I hope he will be ambulating more normally and we can switch him to a boot or shoe.

## 2020-05-15 DIAGNOSIS — M9903 Segmental and somatic dysfunction of lumbar region: Secondary | ICD-10-CM | POA: Diagnosis not present

## 2020-05-15 DIAGNOSIS — M25571 Pain in right ankle and joints of right foot: Secondary | ICD-10-CM | POA: Diagnosis not present

## 2020-05-15 DIAGNOSIS — M5136 Other intervertebral disc degeneration, lumbar region: Secondary | ICD-10-CM | POA: Diagnosis not present

## 2020-05-15 DIAGNOSIS — M5416 Radiculopathy, lumbar region: Secondary | ICD-10-CM | POA: Diagnosis not present

## 2020-05-15 DIAGNOSIS — M62830 Muscle spasm of back: Secondary | ICD-10-CM | POA: Diagnosis not present

## 2020-05-15 DIAGNOSIS — R262 Difficulty in walking, not elsewhere classified: Secondary | ICD-10-CM | POA: Diagnosis not present

## 2020-05-21 DIAGNOSIS — M25571 Pain in right ankle and joints of right foot: Secondary | ICD-10-CM | POA: Diagnosis not present

## 2020-05-21 DIAGNOSIS — R262 Difficulty in walking, not elsewhere classified: Secondary | ICD-10-CM | POA: Diagnosis not present

## 2020-05-23 DIAGNOSIS — M25571 Pain in right ankle and joints of right foot: Secondary | ICD-10-CM | POA: Diagnosis not present

## 2020-05-23 DIAGNOSIS — R262 Difficulty in walking, not elsewhere classified: Secondary | ICD-10-CM | POA: Diagnosis not present

## 2020-05-27 DIAGNOSIS — M25571 Pain in right ankle and joints of right foot: Secondary | ICD-10-CM | POA: Diagnosis not present

## 2020-05-27 DIAGNOSIS — R262 Difficulty in walking, not elsewhere classified: Secondary | ICD-10-CM | POA: Diagnosis not present

## 2020-05-29 DIAGNOSIS — M25571 Pain in right ankle and joints of right foot: Secondary | ICD-10-CM | POA: Diagnosis not present

## 2020-05-29 DIAGNOSIS — R262 Difficulty in walking, not elsewhere classified: Secondary | ICD-10-CM | POA: Diagnosis not present

## 2020-06-03 DIAGNOSIS — R262 Difficulty in walking, not elsewhere classified: Secondary | ICD-10-CM | POA: Diagnosis not present

## 2020-06-03 DIAGNOSIS — M25571 Pain in right ankle and joints of right foot: Secondary | ICD-10-CM | POA: Diagnosis not present

## 2020-06-04 ENCOUNTER — Encounter: Payer: Self-pay | Admitting: *Deleted

## 2020-06-04 ENCOUNTER — Other Ambulatory Visit: Payer: Self-pay

## 2020-06-04 ENCOUNTER — Encounter: Payer: Self-pay | Admitting: Podiatry

## 2020-06-04 ENCOUNTER — Ambulatory Visit (INDEPENDENT_AMBULATORY_CARE_PROVIDER_SITE_OTHER): Payer: Federal, State, Local not specified - PPO | Admitting: Podiatry

## 2020-06-04 DIAGNOSIS — M722 Plantar fascial fibromatosis: Secondary | ICD-10-CM

## 2020-06-04 DIAGNOSIS — Z9889 Other specified postprocedural states: Secondary | ICD-10-CM

## 2020-06-04 DIAGNOSIS — S86011D Strain of right Achilles tendon, subsequent encounter: Secondary | ICD-10-CM | POA: Diagnosis not present

## 2020-06-04 DIAGNOSIS — M7731 Calcaneal spur, right foot: Secondary | ICD-10-CM

## 2020-06-04 NOTE — Progress Notes (Signed)
He presents today date of surgery 03/07/2020 states that physical therapy seems to be helping.  States that he still cannot do any twisting motion and it is still quite painful.  Otherwise he states he is feeling good a little pain of the Achilles but nothing like it was prior to surgery.  Objective: Vital signs are stable alert oriented x3 presents today still in his cam walker.  There is minimal edema no erythema cellulitis drainage or odor incision site is gone to heal uneventfully.  According to him there was a small stitch that was split from the distal aspect of the incision.  Otherwise he has good plantarflexion without pain and good dorsiflexion.  Mild tenderness on direct palpation of the nodular region of his Achilles.  Assessment: Slowly resolving pain status post repair of a ruptured Achilles tendon.  Plan: I will suggest that he continue physical therapy and I will follow-up with him in about a month to 6 weeks.  We are going to put him a Tri-Lock brace to get him back in his tennis shoes with his orthotics and follow-up with him to see how he progresses in 1 month.

## 2020-06-05 DIAGNOSIS — M9903 Segmental and somatic dysfunction of lumbar region: Secondary | ICD-10-CM | POA: Diagnosis not present

## 2020-06-05 DIAGNOSIS — M5416 Radiculopathy, lumbar region: Secondary | ICD-10-CM | POA: Diagnosis not present

## 2020-06-05 DIAGNOSIS — R262 Difficulty in walking, not elsewhere classified: Secondary | ICD-10-CM | POA: Diagnosis not present

## 2020-06-05 DIAGNOSIS — M62830 Muscle spasm of back: Secondary | ICD-10-CM | POA: Diagnosis not present

## 2020-06-05 DIAGNOSIS — M25571 Pain in right ankle and joints of right foot: Secondary | ICD-10-CM | POA: Diagnosis not present

## 2020-06-05 DIAGNOSIS — M5136 Other intervertebral disc degeneration, lumbar region: Secondary | ICD-10-CM | POA: Diagnosis not present

## 2020-06-11 DIAGNOSIS — R262 Difficulty in walking, not elsewhere classified: Secondary | ICD-10-CM | POA: Diagnosis not present

## 2020-06-11 DIAGNOSIS — M25571 Pain in right ankle and joints of right foot: Secondary | ICD-10-CM | POA: Diagnosis not present

## 2020-06-12 DIAGNOSIS — R262 Difficulty in walking, not elsewhere classified: Secondary | ICD-10-CM | POA: Diagnosis not present

## 2020-06-12 DIAGNOSIS — M25571 Pain in right ankle and joints of right foot: Secondary | ICD-10-CM | POA: Diagnosis not present

## 2020-06-20 DIAGNOSIS — M79676 Pain in unspecified toe(s): Secondary | ICD-10-CM

## 2020-07-07 DIAGNOSIS — M5136 Other intervertebral disc degeneration, lumbar region: Secondary | ICD-10-CM | POA: Diagnosis not present

## 2020-07-07 DIAGNOSIS — M9903 Segmental and somatic dysfunction of lumbar region: Secondary | ICD-10-CM | POA: Diagnosis not present

## 2020-07-07 DIAGNOSIS — M5416 Radiculopathy, lumbar region: Secondary | ICD-10-CM | POA: Diagnosis not present

## 2020-07-07 DIAGNOSIS — M62830 Muscle spasm of back: Secondary | ICD-10-CM | POA: Diagnosis not present

## 2020-07-08 ENCOUNTER — Other Ambulatory Visit: Payer: Self-pay

## 2020-07-09 ENCOUNTER — Encounter: Payer: Self-pay | Admitting: Podiatry

## 2020-07-09 ENCOUNTER — Encounter: Payer: Self-pay | Admitting: *Deleted

## 2020-07-09 ENCOUNTER — Ambulatory Visit (INDEPENDENT_AMBULATORY_CARE_PROVIDER_SITE_OTHER): Payer: Federal, State, Local not specified - PPO | Admitting: Podiatry

## 2020-07-09 DIAGNOSIS — S86011D Strain of right Achilles tendon, subsequent encounter: Secondary | ICD-10-CM

## 2020-07-09 DIAGNOSIS — M722 Plantar fascial fibromatosis: Secondary | ICD-10-CM

## 2020-07-09 DIAGNOSIS — M7731 Calcaneal spur, right foot: Secondary | ICD-10-CM | POA: Diagnosis not present

## 2020-07-09 DIAGNOSIS — Z9889 Other specified postprocedural states: Secondary | ICD-10-CM | POA: Diagnosis not present

## 2020-07-09 NOTE — Progress Notes (Signed)
He presents today for his postop visit he is status post Achilles tendon repair.  He states that with the brace my leg is gotten much stronger I stopped taking the meloxicam and doing much better everything feels great.  I am ready to get back to work.  Objective: No edema no cellulitis drainage or odor he has good plantar flexion without resistance and without pain.  He has good plantar flexion against resistance no pain.  Assessment: Well-healing surgical foot right.  Plan: We will allow him to go back to work he will follow-up with me on an as-needed basis.

## 2020-07-11 ENCOUNTER — Other Ambulatory Visit: Payer: Self-pay

## 2020-07-11 ENCOUNTER — Other Ambulatory Visit: Payer: Self-pay | Admitting: Internal Medicine

## 2020-07-11 ENCOUNTER — Telehealth: Payer: Self-pay

## 2020-07-11 ENCOUNTER — Ambulatory Visit: Payer: Federal, State, Local not specified - PPO | Admitting: Internal Medicine

## 2020-07-11 ENCOUNTER — Encounter: Payer: Self-pay | Admitting: Internal Medicine

## 2020-07-11 ENCOUNTER — Ambulatory Visit
Admission: RE | Admit: 2020-07-11 | Discharge: 2020-07-11 | Disposition: A | Discharge status: Home / Self Care | Payer: Federal, State, Local not specified - PPO | Source: Ambulatory Visit | Attending: Internal Medicine | Admitting: Internal Medicine

## 2020-07-11 VITALS — BP 134/88 | HR 62 | Temp 98.2°F | Ht 75.0 in | Wt 260.6 lb

## 2020-07-11 DIAGNOSIS — R109 Unspecified abdominal pain: Secondary | ICD-10-CM | POA: Insufficient documentation

## 2020-07-11 DIAGNOSIS — R1030 Lower abdominal pain, unspecified: Secondary | ICD-10-CM

## 2020-07-11 DIAGNOSIS — K5792 Diverticulitis of intestine, part unspecified, without perforation or abscess without bleeding: Secondary | ICD-10-CM

## 2020-07-11 DIAGNOSIS — K5790 Diverticulosis of intestine, part unspecified, without perforation or abscess without bleeding: Secondary | ICD-10-CM

## 2020-07-11 LAB — SPECIMEN STATUS REPORT

## 2020-07-11 MED ORDER — METRONIDAZOLE 500 MG PO TABS: 500.0000 mg | ORAL_TABLET | Freq: Two times a day (BID) | ORAL | 0 refills | Status: DC

## 2020-07-11 MED ORDER — CIPROFLOXACIN HCL 500 MG PO TABS: 500.0000 mg | ORAL_TABLET | Freq: Two times a day (BID) | ORAL | 0 refills | Status: AC

## 2020-07-11 MED ORDER — IOHEXOL 300 MG/ML  SOLN
100.0000 mL | Freq: Once | INTRAMUSCULAR | Status: AC | PRN
  Administered 2020-07-11: 100 mL via INTRAVENOUS

## 2020-07-11 NOTE — Progress Notes (Signed)
Patient presenting with lower abdominal pain for 5 days. States that this is a long the midline to left side of the abdomen and at times radiated to the back. Patient states this is tender to the touch.   No constipation, slight gas, no urinary changes, no diet change, no nausea or vomiting.

## 2020-07-11 NOTE — Telephone Encounter (Signed)
Bernard Blake from [facility] called & stat CT was done. They have LM on your phone & have not given patient results. Diverticulitis is seen. No call back to them needed.   Please advise?

## 2020-07-11 NOTE — Progress Notes (Signed)
Chief Complaint  Patient presents with  . Abdominal Pain   Acute visit 1. Lower ab pain and llq/left flank (no h/o kidney stones) ab pain 2/10 today 1 week ago was 8/10 cramping/pressure. No n/v/constipation diarrhea had slight gas but not typical of gas pain no diet change other than healthier diet options  Tried probiotic nothing else. Ab pain constant. Last Monday went to chiropractor and they used a roller on the back and felt left lower back and and abdomen    Review of Systems  Constitutional: Negative for weight loss.  HENT: Negative for hearing loss.   Eyes: Negative for blurred vision.  Respiratory: Negative for shortness of breath.   Cardiovascular: Negative for chest pain.  Gastrointestinal: Positive for abdominal pain. Negative for blood in stool, constipation, diarrhea, nausea and vomiting.  Musculoskeletal: Negative for falls and joint pain.  Skin: Negative for rash.   Past Medical History:  Diagnosis Date  . Allergy   . Chicken pox   . Depression   . History of alcohol abuse   . Hypertension   . Low testosterone    Past Surgical History:  Procedure Laterality Date  . ANKLE SURGERY Left    BB removal  . COLONOSCOPY WITH PROPOFOL N/A 01/18/2020   Procedure: COLONOSCOPY WITH PROPOFOL;  Surgeon: Virgel Manifold, MD;  Location: [facility] ENDOSCOPY;  Service: Endoscopy;  Laterality: N/A;   Family History  Problem Relation Age of Onset  . Arthritis Mother   . Hyperlipidemia Mother   . Hypertension Mother   . Arthritis Father   . Hyperlipidemia Father   . Hypertension Father   . Diabetes Father    Social History   Socioeconomic History  . Marital status: Married    Spouse name: Not on file  . Number of children: Not on file  . Years of education: Not on file  . Highest education level: Not on file  Occupational History  . Not on file  Tobacco Use  . Smoking status: Former Smoker    Packs/day: 1.00    Years: 8.00    Pack years: 8.00    Types:  Cigarettes  . Smokeless tobacco: Current User    Types: Snuff  . Tobacco comment: Quit smoking in 1995.  Substance and Sexual Activity  . Alcohol use: No    Alcohol/week: 0.0 standard drinks    Comment: Former alcoholic; now sober  . Drug use: No  . Sexual activity: Yes  Other Topics Concern  . Not on file  Social History Narrative  . Not on file   Social Determinants of Health   Financial Resource Strain: Not on file  Food Insecurity: Not on file  Transportation Needs: Not on file  Physical Activity: Not on file  Stress: Not on file  Social Connections: Not on file  Intimate Partner Violence: Not on file   Current Meds  Medication Sig  . anastrozole (ARIMIDEX) 1 MG tablet SMARTSIG:0.5 Tablet(s) By Mouth Every Other Day  . Ascorbic Acid (VITAMIN C) 1000 MG tablet Take 2,000 mg by mouth daily.  . benazepril (LOTENSIN) 10 MG tablet TAKE 1 TABLET(10 MG) BY MOUTH DAILY  . buPROPion (WELLBUTRIN XL) 300 MG 24 hr tablet TAKE 1 TABLET(300 MG) BY MOUTH DAILY  . Cetirizine HCl (ZYRTEC PO) Take by mouth.  . cholecalciferol (VITAMIN D) 1000 UNITS tablet Take 2,000 Units by mouth daily.  . citalopram (CELEXA) 20 MG tablet TAKE 2 TABLETS(40 MG) BY MOUTH DAILY  . Co-Enzyme Q-10 100 MG CAPS Take  100 mg by mouth.  Marland Kitchen JATENZO 237 MG CAPS Take 1 capsule by mouth 2 (two) times daily.  . Loratadine (CLARITIN PO) Take by mouth.  Marland Kitchen MAGNESIUM PO Take by mouth.  . Omega-3 Fatty Acids (FISH OIL) 1000 MG CAPS Take 2,400 mg by mouth once.  . Probiotic Product (PROBIOTIC PO) Take by mouth.  . selenium 200 MCG TABS tablet Take 200 mcg by mouth daily.  . Zinc Sulfate 66 MG TABS Take 15 mg by mouth.   Allergies  Allergen Reactions  . Tetanus Toxoids Swelling   Recent Results (from the past 2160 hour(s))  Hemoglobin A1c     Status: None   Collection Time: 04/29/20 10:58 AM  Result Value Ref Range   Hgb A1c MFr Bld 5.7 4.6 - 6.5 %    Comment: Glycemic Control Guidelines for People with Diabetes:Non  Diabetic:  <6%Goal of Therapy: <7%Additional Action Suggested:  >8%    Objective  Body mass index is 32.57 kg/m. Wt Readings from Last 3 Encounters:  07/11/20 260 lb 9.6 oz (118.2 kg)  03/03/20 254 lb 6.4 oz (115.4 kg)  01/18/20 255 lb (115.7 kg)   Temp Readings from Last 3 Encounters:  07/11/20 98.2 F (36.8 C) (Oral)  03/03/20 (!) 97.5 F (36.4 C)  01/18/20 (!) 97.5 F (36.4 C)   BP Readings from Last 3 Encounters:  07/11/20 134/88  03/03/20 126/84  01/18/20 125/83   Pulse Readings from Last 3 Encounters:  07/11/20 62  03/03/20 83  01/18/20 (!) 50    Physical Exam Vitals and nursing note reviewed.  Constitutional:      Appearance: Normal appearance. He is well-developed and well-groomed. He is obese.  HENT:     Head: Normocephalic and atraumatic.  Eyes:     Conjunctiva/sclera: Conjunctivae normal.     Pupils: Pupils are equal, round, and reactive to light.  Cardiovascular:     Rate and Rhythm: Normal rate and regular rhythm.     Heart sounds: Normal heart sounds. No murmur heard.   Pulmonary:     Effort: Pulmonary effort is normal.     Breath sounds: Normal breath sounds.  Abdominal:     General: Bowel sounds are normal.     Tenderness: There is no abdominal tenderness.  Skin:    General: Skin is warm and dry.  Neurological:     General: No focal deficit present.     Mental Status: He is alert and oriented to person, place, and time. Mental status is at baseline.     Gait: Gait normal.  Psychiatric:        Attention and Perception: Attention and perception normal.        Mood and Affect: Mood and affect normal.        Speech: Speech normal.        Behavior: Behavior normal. Behavior is cooperative.        Thought Content: Thought content normal.        Cognition and Memory: Cognition and memory normal.        Judgment: Judgment normal.     Assessment  Plan  Lower abdominal pain ddx kidney stones, diverticulitis h/o diverticulosis/uti - Plan: CBC  with Differential/Platelet, Comprehensive metabolic panel, Creatinine, Urinalysis, Routine w reflex microscopic, Urine Culture, CT Abdomen Pelvis W Contrast F/u PCP as sch  Further tx pending CT scan results  Provider: Dr. Olivia Mackie McLean-Scocuzza-[facility]

## 2020-07-11 NOTE — Telephone Encounter (Signed)
Received a call from [facility] on a stat lab that was ordered.   Creatinine: 1.03 normal range  She also wanted to let us know that there was a CBC ordered but they did not receive a lavender tube.

## 2020-07-11 NOTE — Patient Instructions (Signed)
Debrox ear wax drops   Abdominal Pain, Adult Pain in the abdomen (abdominal pain) can be caused by many things. Often, abdominal pain is not serious and it gets better with no treatment or by being treated at home. However, sometimes abdominal pain is serious. Your health care provider will ask questions about your medical history and do a physical exam to try to determine the cause of your abdominal pain. Follow these instructions at home: Medicines  Take over-the-counter and prescription medicines only as told by your health care provider.  Do not take a laxative unless told by your health care provider. General instructions  Watch your condition for any changes.  Drink enough fluid to keep your urine pale yellow.  Keep all follow-up visits as told by your health care provider. This is important.   Contact a health care provider if:  Your abdominal pain changes or gets worse.  You are not hungry or you lose weight without trying.  You are constipated or have diarrhea for more than 2-3 days.  You have pain when you urinate or have a bowel movement.  Your abdominal pain wakes you up at night.  Your pain gets worse with meals, after eating, or with certain foods.  You are vomiting and cannot keep anything down.  You have a fever.  You have blood in your urine. Get help right away if:  Your pain does not go away as soon as your health care provider told you to expect.  You cannot stop vomiting.  Your pain is only in areas of the abdomen, such as the right side or the left lower portion of the abdomen. Pain on the right side could be caused by appendicitis.  You have bloody or black stools, or stools that look like tar.  You have severe pain, cramping, or bloating in your abdomen.  You have signs of dehydration, such as: ? Dark urine, very little urine, or no urine. ? Cracked lips. ? Dry mouth. ? Sunken eyes. ? Sleepiness. ? Weakness.  You have trouble breathing  or chest pain. Summary  Often, abdominal pain is not serious and it gets better with no treatment or by being treated at home. However, sometimes abdominal pain is serious.  Watch your condition for any changes.  Take over-the-counter and prescription medicines only as told by your health care provider.  Contact a health care provider if your abdominal pain changes or gets worse.  Get help right away if you have severe pain, cramping, or bloating in your abdomen. This information is not intended to replace advice given to you by your health care provider. Make sure you discuss any questions you have with your health care provider. Document Revised: 04/27/2019 Document Reviewed: 07/17/2018 Elsevier Patient Education  [place].

## 2020-07-12 LAB — COMPREHENSIVE METABOLIC PANEL
ALT: 31 IU/L (ref 0–44)
AST: 26 IU/L (ref 0–40)
Albumin/Globulin Ratio: 1.9 (ref 1.2–2.2)
Albumin: 4.7 g/dL (ref 3.8–4.9)
Alkaline Phosphatase: 84 IU/L (ref 44–121)
BUN/Creatinine Ratio: 15 (ref 9–20)
BUN: 15 mg/dL (ref 6–24)
Bilirubin Total: 0.3 mg/dL (ref 0.0–1.2)
CO2: 24 mmol/L (ref 20–29)
Calcium: 9.7 mg/dL (ref 8.7–10.2)
Chloride: 99 mmol/L (ref 96–106)
Creatinine, Ser: 1.03 mg/dL (ref 0.76–1.27)
Globulin, Total: 2.5 g/dL (ref 1.5–4.5)
Glucose: 92 mg/dL (ref 65–99)
Potassium: 4.5 mmol/L (ref 3.5–5.2)
Sodium: 139 mmol/L (ref 134–144)
Total Protein: 7.2 g/dL (ref 6.0–8.5)
eGFR: 88 mL/min/{1.73_m2} (ref >59)

## 2020-07-12 LAB — CBC WITH DIFFERENTIAL/PLATELET

## 2020-07-12 LAB — URINALYSIS, ROUTINE W REFLEX MICROSCOPIC
Bilirubin, UA: NEGATIVE
Glucose, UA: NEGATIVE
Ketones, UA: NEGATIVE
Leukocytes,UA: NEGATIVE
Nitrite, UA: NEGATIVE
RBC, UA: NEGATIVE
Specific Gravity, UA: 1.018 (ref 1.005–1.030)
Urobilinogen, Ur: 1 mg/dL (ref 0.2–1.0)
pH, UA: 6.5 (ref 5.0–7.5)

## 2020-07-14 ENCOUNTER — Encounter: Payer: Self-pay | Admitting: *Deleted

## 2020-07-14 ENCOUNTER — Encounter: Payer: Self-pay | Admitting: Internal Medicine

## 2020-07-14 DIAGNOSIS — K5792 Diverticulitis of intestine, part unspecified, without perforation or abscess without bleeding: Secondary | ICD-10-CM | POA: Insufficient documentation

## 2020-07-14 LAB — CBC WITH DIFFERENTIAL/PLATELET
Basophils Absolute: 0.1 10*3/uL (ref 0.0–0.2)
Basos: 1 %
EOS (ABSOLUTE): 0.1 10*3/uL (ref 0.0–0.4)
Eos: 3 %
Hematocrit: 48.4 % (ref 37.5–51.0)
Hemoglobin: 16.3 g/dL (ref 13.0–17.7)
Immature Grans (Abs): 0 10*3/uL (ref 0.0–0.1)
Immature Granulocytes: 1 %
Lymphocytes Absolute: 1.4 10*3/uL (ref 0.7–3.1)
Lymphs: 31 %
MCH: 28.2 pg (ref 26.6–33.0)
MCHC: 33.7 g/dL (ref 31.5–35.7)
MCV: 84 fL (ref 79–97)
Monocytes Absolute: 0.6 10*3/uL (ref 0.1–0.9)
Monocytes: 14 %
Neutrophils Absolute: 2.2 10*3/uL (ref 1.4–7.0)
Neutrophils: 50 %
Platelets: 388 10*3/uL (ref 150–450)
RBC: 5.78 x10E6/uL (ref 4.14–5.80)
RDW: 13.7 % (ref 11.6–15.4)
WBC: 4.4 10*3/uL (ref 3.4–10.8)

## 2020-07-14 LAB — URINE CULTURE

## 2020-07-14 LAB — COMPREHENSIVE METABOLIC PANEL

## 2020-07-14 NOTE — Addendum Note (Signed)
Addended by: Orland Mustard on: 07/14/2020 08:29 AM   Modules accepted: Orders

## 2020-07-21 DIAGNOSIS — M9903 Segmental and somatic dysfunction of lumbar region: Secondary | ICD-10-CM | POA: Diagnosis not present

## 2020-07-21 DIAGNOSIS — M5136 Other intervertebral disc degeneration, lumbar region: Secondary | ICD-10-CM | POA: Diagnosis not present

## 2020-07-21 DIAGNOSIS — M5416 Radiculopathy, lumbar region: Secondary | ICD-10-CM | POA: Diagnosis not present

## 2020-07-21 DIAGNOSIS — M62830 Muscle spasm of back: Secondary | ICD-10-CM | POA: Diagnosis not present

## 2020-07-23 ENCOUNTER — Telehealth: Payer: Self-pay | Admitting: Gastroenterology

## 2020-07-23 NOTE — Telephone Encounter (Signed)
Called LVM for patient to call back to schedule 6 week f/u per Dr T.

## 2020-08-06 DIAGNOSIS — M62830 Muscle spasm of back: Secondary | ICD-10-CM | POA: Diagnosis not present

## 2020-08-06 DIAGNOSIS — M5136 Other intervertebral disc degeneration, lumbar region: Secondary | ICD-10-CM | POA: Diagnosis not present

## 2020-08-06 DIAGNOSIS — M5416 Radiculopathy, lumbar region: Secondary | ICD-10-CM | POA: Diagnosis not present

## 2020-08-06 DIAGNOSIS — M9903 Segmental and somatic dysfunction of lumbar region: Secondary | ICD-10-CM | POA: Diagnosis not present

## 2020-08-22 DIAGNOSIS — M9903 Segmental and somatic dysfunction of lumbar region: Secondary | ICD-10-CM | POA: Diagnosis not present

## 2020-08-22 DIAGNOSIS — M62830 Muscle spasm of back: Secondary | ICD-10-CM | POA: Diagnosis not present

## 2020-08-22 DIAGNOSIS — M5416 Radiculopathy, lumbar region: Secondary | ICD-10-CM | POA: Diagnosis not present

## 2020-08-22 DIAGNOSIS — M5136 Other intervertebral disc degeneration, lumbar region: Secondary | ICD-10-CM | POA: Diagnosis not present

## 2020-09-01 ENCOUNTER — Ambulatory Visit: Payer: Federal, State, Local not specified - PPO | Admitting: Family

## 2020-09-01 ENCOUNTER — Encounter: Payer: Self-pay | Admitting: Family

## 2020-09-01 ENCOUNTER — Other Ambulatory Visit: Payer: Self-pay

## 2020-09-01 VITALS — BP 122/90 | HR 67 | Temp 97.1°F | Ht 75.0 in | Wt 251.6 lb

## 2020-09-01 DIAGNOSIS — I10 Essential (primary) hypertension: Secondary | ICD-10-CM

## 2020-09-01 DIAGNOSIS — F32A Depression, unspecified: Secondary | ICD-10-CM | POA: Diagnosis not present

## 2020-09-01 NOTE — Assessment & Plan Note (Signed)
DBP slightly elevated. Discussed DASH and mediterranean diet. He will also purchase BP machine and spot check at home. Continue lostensin 10mg .

## 2020-09-01 NOTE — Patient Instructions (Addendum)
Please monitor blood pressure at home.  Goal less than 120/80  Please review following resources as well  Nice to see you!   [email or web address]">  DASH Eating Plan DASH stands for Dietary Approaches to Stop Hypertension. The DASH eating plan is a healthy eating plan that has been shown to: Reduce high blood pressure (hypertension). Reduce your risk for type 2 diabetes, heart disease, and stroke. Help with weight loss. What are tips for following this plan? Reading food labels Check food labels for the amount of salt (sodium) per serving. Choose foods with less than 5 percent of the Daily Value of sodium. Generally, foods with less than 300 milligrams (mg) of sodium per serving fit into this eating plan. To find whole grains, look for the word "whole" as the first word in the ingredient list. Shopping Buy products labeled as "low-sodium" or "no salt added." Buy fresh foods. Avoid canned foods and pre-made or frozen meals. Cooking Avoid adding salt when cooking. Use salt-free seasonings or herbs instead of table salt or sea salt. Check with your health care provider or pharmacist before using salt substitutes. Do not fry foods. Cook foods using healthy methods such as baking, boiling, grilling, roasting, and broiling instead. Cook with heart-healthy oils, such as olive, canola, avocado, soybean, or sunflower oil. Meal planning  Eat a balanced diet that includes: 4 or more servings of fruits and 4 or more servings of vegetables each day. Try to fill one-half of your plate with fruits and vegetables. 6-8 servings of whole grains each day. Less than 6 oz (170 g) of lean meat, poultry, or fish each day. A 3-oz (85-g) serving of meat is about the same size as a deck of cards. One egg equals 1 oz (28 g). 2-3 servings of low-fat dairy each day. One serving is 1 cup (237 mL). 1 serving of nuts, seeds, or beans 5 times each week. 2-3 servings of  heart-healthy fats. Healthy fats called omega-3 fatty acids are found in foods such as walnuts, flaxseeds, fortified milks, and eggs. These fats are also found in cold-water fish, such as sardines, salmon, and mackerel. Limit how much you eat of: Canned or prepackaged foods. Food that is high in trans fat, such as some fried foods. Food that is high in saturated fat, such as fatty meat. Desserts and other sweets, sugary drinks, and other foods with added sugar. Full-fat dairy products. Do not salt foods before eating. Do not eat more than 4 egg yolks a week. Try to eat at least 2 vegetarian meals a week. Eat more home-cooked food and less restaurant, buffet, and fast food.  Lifestyle When eating at a restaurant, ask that your food be prepared with less salt or no salt, if possible. If you drink alcohol: Limit how much you use to: 0-1 drink a day for women who are not pregnant. 0-2 drinks a day for men. Be aware of how much alcohol is in your drink. In the [place] one drink equals one 12 oz bottle of beer (355 mL), one 5 oz glass of wine (148 mL), or one 1 oz glass of hard liquor (44 mL). General information Avoid eating more than 2,300 mg of salt a day. If you have hypertension, you may need to reduce your sodium intake to 1,500 mg a day. Work with your health care provider to maintain a healthy body weight or to lose weight. Ask what an ideal weight is for you. Get at least 30 minutes of exercise that causes  your heart to beat faster (aerobic exercise) most days of the week. Activities may include walking, swimming, or biking. Work with your health care provider or dietitian to adjust your eating plan to your individual calorie needs. What foods should I eat? Fruits All fresh, dried, or frozen fruit. Canned fruit in natural juice (without addedsugar). Vegetables Fresh or frozen vegetables (raw, steamed, roasted, or grilled). Low-sodium or reduced-sodium tomato and vegetable juice.  Low-sodium or reduced-sodium tomatosauce and tomato paste. Low-sodium or reduced-sodium canned vegetables. Grains Whole-grain or whole-wheat bread. Whole-grain or whole-wheat pasta. Brown rice. [place]. Bulgur. Whole-grain and low-sodium cereals. Pita bread.Low-fat, low-sodium crackers. Whole-wheat flour tortillas. Meats and other proteins Skinless chicken or [place]. Ground chicken or [place]. Pork with fat trimmed off. Fish and seafood. Egg whites. Dried beans, peas, or lentils. Unsalted nuts, nut butters, and seeds. Unsalted canned beans. Lean cuts of beef with fat trimmed off. Low-sodium, lean precooked or cured meat, such as sausages or meatloaves. Dairy Low-fat (1%) or fat-free (skim) milk. Reduced-fat, low-fat, or fat-free cheeses. Nonfat, low-sodium ricotta or cottage cheese. Low-fat or nonfatyogurt. Low-fat, low-sodium cheese. Fats and oils Soft margarine without trans fats. Vegetable oil. Reduced-fat, low-fat, or light mayonnaise and salad dressings (reduced-sodium). Canola, safflower, olive, avocado, soybean, andsunflower oils. Avocado. Seasonings and condiments Herbs. Spices. Seasoning mixes without salt. Other foods Unsalted popcorn and pretzels. Fat-free sweets. The items listed above may not be a complete list of foods and beverages you can eat. Contact a dietitian for more information. What foods should I avoid? Fruits Canned fruit in a light or heavy syrup. Fried fruit. Fruit in cream or buttersauce. Vegetables Creamed or fried vegetables. Vegetables in a cheese sauce. Regular canned vegetables (not low-sodium or reduced-sodium). Regular canned tomato sauce and paste (not low-sodium or reduced-sodium). Regular tomato and vegetable juice(not low-sodium or reduced-sodium). Angie Fava. Olives. Grains Baked goods made with fat, such as croissants, muffins, or some breads. Drypasta or rice meal packs. Meats and other proteins Fatty cuts of meat. Ribs. Fried meat. Berniece Salines. Bologna,  salami, and other precooked or cured meats, such as sausages or meat loaves. Fat from the back of a pig (fatback). Bratwurst. Salted nuts and seeds. Canned beans with added salt. Canned orsmoked fish. Whole eggs or egg yolks. Chicken or [place] with skin. Dairy Whole or 2% milk, cream, and half-and-half. Whole or full-fat cream cheese. Whole-fat or sweetened yogurt. Full-fat cheese. Nondairy creamers. Whippedtoppings. Processed cheese and cheese spreads. Fats and oils Butter. Stick margarine. Lard. Shortening. Ghee. Bacon fat. Tropical oils, suchas coconut, palm kernel, or palm oil. Seasonings and condiments Onion salt, garlic salt, seasoned salt, table salt, and sea salt. Worcestershire sauce. Tartar sauce. Barbecue sauce. Teriyaki sauce. Soy sauce, including reduced-sodium. Steak sauce. Canned and packaged gravies. Fish sauce. Oyster sauce. Cocktail sauce. Store-bought horseradish. Ketchup. Mustard. Meat flavorings and tenderizers. Bouillon cubes. Hot sauces. Pre-made or packaged marinades. Pre-made or packaged taco seasonings. Relishes. Regular saladdressings. Other foods Salted popcorn and pretzels. The items listed above may not be a complete list of foods and beverages you should avoid. Contact a dietitian for more information. Where to find more information National Heart, Lung, and Blood Institute: https://wilson-eaton.com/ American Heart Association: www.heart.org Academy of Nutrition and Dietetics: www.eatright.Rives: www.kidney.org Summary The DASH eating plan is a healthy eating plan that has been shown to reduce high blood pressure (hypertension). It may also reduce your risk for type 2 diabetes, heart disease, and stroke. When on the DASH eating plan, aim to eat more fresh  fruits and vegetables, whole grains, lean proteins, low-fat dairy, and heart-healthy fats. With the DASH eating plan, you should limit salt (sodium) intake to 2,300 mg a day. If you have  hypertension, you may need to reduce your sodium intake to 1,500 mg a day. Work with your health care provider or dietitian to adjust your eating plan to your individual calorie needs. This information is not intended to replace advice given to you by your health care provider. Make sure you discuss any questions you have with your healthcare provider. Document Revised: 02/09/2019 Document Reviewed: 02/09/2019 Elsevier Patient Education  2022 [place] Your Hypertension Hypertension, also called high blood pressure, is when the force of the blood pressing against the walls of the arteries is too strong. Arteries are blood vessels that carry blood from your heart throughout your body. Hypertension forces the heart to work harder to pump blood and may cause the arteries tobecome narrow or stiff. Understanding blood pressure readings Your personal target blood pressure may vary depending on your medical conditions, your age, and other factors. A blood pressure reading includes a higher number over a lower number. Ideally, your blood pressure should be below 120/80. You should know that: The first, or top, number is called the systolic pressure. It is a measure of the pressure in your arteries as your heart beats. The second, or bottom number, is called the diastolic pressure. It is a measure of the pressure in your arteries as the heart relaxes. Blood pressure is classified into four stages. Based on your blood pressure reading, your health care provider may use the following stages to determine what type of treatment you need, if any. Systolic pressure and diastolicpressure are measured in a unit called mmHg. Normal Systolic pressure: below 875. Diastolic pressure: below 80. Elevated Systolic pressure: 643-329. Diastolic pressure: below 80. Hypertension stage 1 Systolic pressure: 518-841. Diastolic pressure: 66-06. Hypertension stage 2 Systolic pressure: 301 or above. Diastolic  pressure: 90 or above. How can this condition affect me? Managing your hypertension is an important responsibility. Over time, hypertension can damage the arteries and decrease blood flow to important parts of the body, including the brain, heart, and kidneys. Having untreated or uncontrolled hypertension can lead to: A heart attack. A stroke. A weakened blood vessel (aneurysm). Heart failure. Kidney damage. Eye damage. Metabolic syndrome. Memory and concentration problems. Vascular dementia. What actions can I take to manage this condition? Hypertension can be managed by making lifestyle changes and possibly by taking medicines. Your health care provider will help you make a plan to bring yourblood pressure within a normal range. Nutrition  Eat a diet that is high in fiber and potassium, and low in salt (sodium), added sugar, and fat. An example eating plan is called the Dietary Approaches to Stop Hypertension (DASH) diet. To eat this way: Eat plenty of fresh fruits and vegetables. Try to fill one-half of your plate at each meal with fruits and vegetables. Eat whole grains, such as whole-wheat pasta, brown rice, or whole-grain bread. Fill about one-fourth of your plate with whole grains. Eat low-fat dairy products. Avoid fatty cuts of meat, processed or cured meats, and poultry with skin. Fill about one-fourth of your plate with lean proteins such as fish, chicken without skin, beans, eggs, and tofu. Avoid pre-made and processed foods. These tend to be higher in sodium, added sugar, and fat. Reduce your daily sodium intake. Most people with hypertension should eat less than 1,500 mg of sodium a day.  Lifestyle  Work with your health care provider to maintain a healthy body weight or to lose weight. Ask what an ideal weight is for you. Get at least 30 minutes of exercise that causes your heart to beat faster (aerobic exercise) most days of the week. Activities may include walking,  swimming, or biking. Include exercise to strengthen your muscles (resistance exercise), such as weight lifting, as part of your weekly exercise routine. Try to do these types of exercises for 30 minutes at least 3 days a week. Do not use any products that contain nicotine or tobacco, such as cigarettes, e-cigarettes, and chewing tobacco. If you need help quitting, ask your health care provider. Control any long-term (chronic) conditions you have, such as high cholesterol or diabetes. Identify your sources of stress and find ways to manage stress. This may include meditation, deep breathing, or making time for fun activities.  Alcohol use Do not drink alcohol if: Your health care provider tells you not to drink. You are pregnant, may be pregnant, or are planning to become pregnant. If you drink alcohol: Limit how much you use to: 0-1 drink a day for women. 0-2 drinks a day for men. Be aware of how much alcohol is in your drink. In the [place] one drink equals one 12 oz bottle of beer (355 mL), one 5 oz glass of wine (148 mL), or one 1 oz glass of hard liquor (44 mL). Medicines Your health care provider may prescribe medicine if lifestyle changes are not enough to get your blood pressure under control and if: Your systolic blood pressure is 130 or higher. Your diastolic blood pressure is 80 or higher. Take medicines only as told by your health care provider. Follow the directions carefully. Blood pressure medicines must be taken as told by your health care provider. The medicine does not work as well when you skip doses. Skippingdoses also puts you at risk for problems. Monitoring Before you monitor your blood pressure: Do not smoke, drink caffeinated beverages, or exercise within 30 minutes before taking a measurement. Use the bathroom and empty your bladder (urinate). Sit quietly for at least 5 minutes before taking measurements. Monitor your blood pressure at home as told by your health care  provider. To do this: Sit with your back straight and supported. Place your feet flat on the floor. Do not cross your legs. Support your arm on a flat surface, such as a table. Make sure your upper arm is at heart level. Each time you measure, take two or three readings one minute apart and record the results. You may also need to have your blood pressure checked regularly by your healthcare provider. General information Talk with your health care provider about your diet, exercise habits, and other lifestyle factors that may be contributing to hypertension. Review all the medicines you take with your health care provider because there may be side effects or interactions. Keep all visits as told by your health care provider. Your health care provider can help you create and adjust your plan for managing your high blood pressure. Where to find more information National Heart, Lung, and Blood Institute: https://wilson-eaton.com/ American Heart Association: www.heart.org Contact a health care provider if: You think you are having a reaction to medicines you have taken. You have repeated (recurrent) headaches. You feel dizzy. You have swelling in your ankles. You have trouble with your vision. Get help right away if: You develop a severe headache or confusion. You have unusual weakness or numbness,  or you feel faint. You have severe pain in your chest or abdomen. You vomit repeatedly. You have trouble breathing. These symptoms may represent a serious problem that is an emergency. Do not wait to see if the symptoms will go away. Get medical help right away. Call your local emergency services (911 in the [place] Do not drive yourself to the hospital. Summary Hypertension is when the force of blood pumping through your arteries is too strong. If this condition is not controlled, it may put you at risk for serious complications. Your personal target blood pressure may vary depending on your medical  conditions, your age, and other factors. For most people, a normal blood pressure is less than 120/80. Hypertension is managed by lifestyle changes, medicines, or both. Lifestyle changes to help manage hypertension include losing weight, eating a healthy, low-sodium diet, exercising more, stopping smoking, and limiting alcohol. This information is not intended to replace advice given to you by your health care provider. Make sure you discuss any questions you have with your healthcare provider. Document Revised: 04/13/2019 Document Reviewed: 02/06/2019 Elsevier Patient Education  2022 [employer].

## 2020-09-01 NOTE — Addendum Note (Signed)
Addended by: Leeanne Rio on: 09/01/2020 03:06 PM   Modules accepted: Orders

## 2020-09-01 NOTE — Progress Notes (Signed)
Subjective:    Patient ID: Bernard Blake, male    DOB: [birth year], 50 y.o.   MRN: [id number]  CC: Bernard Blake is a 50 y.o. male who presents today for follow up.   HPI: Feels well today  No complaints  HTN- compliant with lotensin 10mg . No cp, ha, dizziness.   Will take mobic 15mg  occasionally for right ankle pain, he has used 3x this pask week.    Depression- compliant with wellbutrin 300mg , celexa 20mg  and feels regimen adequate.   Colonoscopy UTD, due 12/2020  Quit smoking 27 years ago    HISTORY:  Past Medical History:  Diagnosis Date   Allergy    Chicken pox    Depression    Diverticulitis    07/11/20   History of alcohol abuse    Hypertension    Low testosterone    Past Surgical History:  Procedure Laterality Date   ANKLE SURGERY Left    BB removal   COLONOSCOPY WITH PROPOFOL N/A 01/18/2020   Procedure: COLONOSCOPY WITH PROPOFOL;  Surgeon: Bernard Manifold, MD;  Location: [facility] ENDOSCOPY;  Service: Endoscopy;  Laterality: N/A;   Family History  Problem Relation Age of Onset   Arthritis Mother    Hyperlipidemia Mother    Hypertension Mother    Arthritis Father    Hyperlipidemia Father    Hypertension Father    Diabetes Father     Allergies: Tetanus toxoids Current Outpatient Medications on File Prior to Visit  Medication Sig Dispense Refill   Ascorbic Acid (VITAMIN C) 1000 MG tablet Take 2,000 mg by mouth daily.     benazepril (LOTENSIN) 10 MG tablet TAKE 1 TABLET(10 MG) BY MOUTH DAILY 90 tablet 2   buPROPion (WELLBUTRIN XL) 300 MG 24 hr tablet TAKE 1 TABLET(300 MG) BY MOUTH DAILY 90 tablet 1   Cetirizine HCl (ZYRTEC PO) Take by mouth.     cholecalciferol (VITAMIN D) 1000 UNITS tablet Take 2,000 Units by mouth daily.     citalopram (CELEXA) 20 MG tablet TAKE 2 TABLETS(40 MG) BY MOUTH DAILY 120 tablet 1   Co-Enzyme Q-10 100 MG CAPS Take 100 mg by mouth.     JATENZO 237 MG CAPS Take 1 capsule by mouth 2 (two) times daily.     Loratadine  (CLARITIN PO) Take by mouth.     MAGNESIUM PO Take by mouth.     Omega-3 Fatty Acids (FISH OIL) 1000 MG CAPS Take 2,400 mg by mouth once.     Probiotic Product (PROBIOTIC PO) Take by mouth.     selenium 200 MCG TABS tablet Take 200 mcg by mouth daily.     Zinc Sulfate 66 MG TABS Take 15 mg by mouth.     meloxicam (MOBIC) 15 MG tablet TAKE 1 TABLET(15 MG) BY MOUTH DAILY (Patient not taking: No sig reported) 30 tablet 3   No current facility-administered medications on file prior to visit.    Social History   Tobacco Use   Smoking status: Former    Packs/day: 1.00    Years: 8.00    Pack years: 8.00    Types: Cigarettes   Smokeless tobacco: Current    Types: Snuff   Tobacco comments:    Quit smoking in 1995.  Substance Use Topics   Alcohol use: No    Alcohol/week: 0.0 standard drinks    Comment: Former alcoholic; now sober   Drug use: No    Review of Systems  Constitutional:  Negative for chills and fever.  Respiratory:  Negative for cough.   Cardiovascular:  Negative for chest pain and palpitations.  Gastrointestinal:  Negative for nausea and vomiting.     Objective:    BP 122/90   Pulse 67   Temp (!) 97.1 F (36.2 C) (Skin)   Ht 6\' 3"  (1.905 m)   Wt 251 lb 9.6 oz (114.1 kg)   SpO2 98%   BMI 31.45 kg/m  BP Readings from Last 3 Encounters:  09/01/20 122/90  07/11/20 134/88  03/03/20 126/84   Wt Readings from Last 3 Encounters:  09/01/20 251 lb 9.6 oz (114.1 kg)  07/11/20 260 lb 9.6 oz (118.2 kg)  03/03/20 254 lb 6.4 oz (115.4 kg)    Physical Exam Vitals reviewed.  Constitutional:      Appearance: He is well-developed.  Cardiovascular:     Rate and Rhythm: Regular rhythm.     Heart sounds: Normal heart sounds.  Pulmonary:     Effort: Pulmonary effort is normal. No respiratory distress.     Breath sounds: Normal breath sounds. No wheezing, rhonchi or rales.  Skin:    General: Skin is warm and dry.  Neurological:     Mental Status: He is alert.   Psychiatric:        Speech: Speech normal.        Behavior: Behavior normal.       Assessment & Plan:   Problem List Items Addressed This Visit       Cardiovascular and Mediastinum   Essential hypertension - Primary    DBP slightly elevated. Discussed DASH and mediterranean diet. He will also purchase BP machine and spot check at home. Continue lostensin 10mg .        Relevant Orders   Comprehensive metabolic panel     Other   Depression    Controlled. Continue wellbutrin 300mg , celexa 20mg           I have discontinued Marsha Wertenberger's anastrozole and metroNIDAZOLE. I am also having him maintain his Fish Oil, vitamin C, cholecalciferol, Zinc Sulfate, Co-Enzyme Q-10, citalopram, Jatenzo, selenium, buPROPion, meloxicam, benazepril, MAGNESIUM PO, Cetirizine HCl (ZYRTEC PO), Loratadine (CLARITIN PO), and Probiotic Product (PROBIOTIC PO).   No orders of the defined types were placed in this encounter.   Return precautions given.   Risks, benefits, and alternatives of the medications and treatment plan prescribed today were discussed, and patient expressed understanding.   Education regarding symptom management and diagnosis given to patient on AVS.  Continue to follow with [name], FNP for routine health maintenance.   Bernard Blake and I agreed with plan.   [name], FNP

## 2020-09-01 NOTE — Assessment & Plan Note (Signed)
Controlled. Continue wellbutrin 300mg , celexa 20mg 

## 2020-09-09 ENCOUNTER — Other Ambulatory Visit (INDEPENDENT_AMBULATORY_CARE_PROVIDER_SITE_OTHER): Payer: Federal, State, Local not specified - PPO

## 2020-09-09 ENCOUNTER — Other Ambulatory Visit: Payer: Self-pay

## 2020-09-09 DIAGNOSIS — I10 Essential (primary) hypertension: Secondary | ICD-10-CM | POA: Diagnosis not present

## 2020-09-10 LAB — COMPREHENSIVE METABOLIC PANEL
ALT: 25 U/L (ref 0–53)
AST: 21 U/L (ref 0–37)
Albumin: 4.8 g/dL (ref 3.5–5.2)
Alkaline Phosphatase: 99 U/L (ref 39–117)
BUN: 13 mg/dL (ref 6–23)
CO2: 30 mEq/L (ref 19–32)
Calcium: 9.7 mg/dL (ref 8.4–10.5)
Chloride: 98 mEq/L (ref 96–112)
Creatinine, Ser: 1.18 mg/dL (ref 0.40–1.50)
GFR: 71.25 mL/min (ref >60.00)
Glucose, Bld: 115 mg/dL — ABNORMAL HIGH (ref 70–99)
Potassium: 3.7 mEq/L (ref 3.5–5.1)
Sodium: 138 mEq/L (ref 135–145)
Total Bilirubin: 0.5 mg/dL (ref 0.2–1.2)
Total Protein: 6.9 g/dL (ref 6.0–8.3)

## 2020-09-17 DIAGNOSIS — D2262 Melanocytic nevi of left upper limb, including shoulder: Secondary | ICD-10-CM | POA: Diagnosis not present

## 2020-09-17 DIAGNOSIS — D2271 Melanocytic nevi of right lower limb, including hip: Secondary | ICD-10-CM | POA: Diagnosis not present

## 2020-09-17 DIAGNOSIS — M5416 Radiculopathy, lumbar region: Secondary | ICD-10-CM | POA: Diagnosis not present

## 2020-09-17 DIAGNOSIS — D225 Melanocytic nevi of trunk: Secondary | ICD-10-CM | POA: Diagnosis not present

## 2020-09-17 DIAGNOSIS — M62830 Muscle spasm of back: Secondary | ICD-10-CM | POA: Diagnosis not present

## 2020-09-17 DIAGNOSIS — M9903 Segmental and somatic dysfunction of lumbar region: Secondary | ICD-10-CM | POA: Diagnosis not present

## 2020-09-17 DIAGNOSIS — D2261 Melanocytic nevi of right upper limb, including shoulder: Secondary | ICD-10-CM | POA: Diagnosis not present

## 2020-09-17 DIAGNOSIS — M5136 Other intervertebral disc degeneration, lumbar region: Secondary | ICD-10-CM | POA: Diagnosis not present

## 2020-10-13 DIAGNOSIS — N5201 Erectile dysfunction due to arterial insufficiency: Secondary | ICD-10-CM | POA: Diagnosis not present

## 2020-10-13 DIAGNOSIS — M9903 Segmental and somatic dysfunction of lumbar region: Secondary | ICD-10-CM | POA: Diagnosis not present

## 2020-10-13 DIAGNOSIS — N401 Enlarged prostate with lower urinary tract symptoms: Secondary | ICD-10-CM | POA: Diagnosis not present

## 2020-10-13 DIAGNOSIS — M5136 Other intervertebral disc degeneration, lumbar region: Secondary | ICD-10-CM | POA: Diagnosis not present

## 2020-10-13 DIAGNOSIS — M62830 Muscle spasm of back: Secondary | ICD-10-CM | POA: Diagnosis not present

## 2020-10-13 DIAGNOSIS — E291 Testicular hypofunction: Secondary | ICD-10-CM | POA: Diagnosis not present

## 2020-10-13 DIAGNOSIS — Z79899 Other long term (current) drug therapy: Secondary | ICD-10-CM | POA: Diagnosis not present

## 2020-10-13 DIAGNOSIS — M5416 Radiculopathy, lumbar region: Secondary | ICD-10-CM | POA: Diagnosis not present

## 2020-10-21 ENCOUNTER — Other Ambulatory Visit: Payer: Self-pay

## 2020-10-21 ENCOUNTER — Ambulatory Visit
Admission: RE | Admit: 2020-10-21 | Discharge: 2020-10-21 | Disposition: A | Discharge status: Home / Self Care | Payer: Federal, State, Local not specified - PPO | Source: Ambulatory Visit | Attending: Urology | Admitting: Urology

## 2020-10-29 ENCOUNTER — Ambulatory Visit
Admission: RE | Admit: 2020-10-29 | Discharge: 2020-10-29 | Disposition: A | Discharge status: Home / Self Care | Payer: Federal, State, Local not specified - PPO | Source: Ambulatory Visit | Attending: Family | Admitting: Family

## 2020-10-29 ENCOUNTER — Other Ambulatory Visit: Payer: Self-pay

## 2020-10-29 DIAGNOSIS — D750 Familial erythrocytosis: Secondary | ICD-10-CM | POA: Diagnosis not present

## 2020-10-29 LAB — CBC
HCT: 48.4 % (ref 39.0–52.0)
Hemoglobin: 16 g/dL (ref 13.0–17.0)
MCH: 28.2 pg (ref 26.0–34.0)
MCHC: 33.1 g/dL (ref 30.0–36.0)
MCV: 85.4 fL (ref 80.0–100.0)
Platelets: 251 10*3/uL (ref 150–400)
RBC: 5.67 MIL/uL (ref 4.22–5.81)
RDW: 14.1 % (ref 11.5–15.5)
WBC: 5.2 10*3/uL (ref 4.0–10.5)
nRBC: 0 % (ref 0.0–0.2)

## 2020-11-14 DIAGNOSIS — M5416 Radiculopathy, lumbar region: Secondary | ICD-10-CM | POA: Diagnosis not present

## 2020-11-14 DIAGNOSIS — M5136 Other intervertebral disc degeneration, lumbar region: Secondary | ICD-10-CM | POA: Diagnosis not present

## 2020-11-14 DIAGNOSIS — M9903 Segmental and somatic dysfunction of lumbar region: Secondary | ICD-10-CM | POA: Diagnosis not present

## 2020-11-14 DIAGNOSIS — M62830 Muscle spasm of back: Secondary | ICD-10-CM | POA: Diagnosis not present

## 2020-12-10 DIAGNOSIS — M5416 Radiculopathy, lumbar region: Secondary | ICD-10-CM | POA: Diagnosis not present

## 2020-12-10 DIAGNOSIS — M5136 Other intervertebral disc degeneration, lumbar region: Secondary | ICD-10-CM | POA: Diagnosis not present

## 2020-12-10 DIAGNOSIS — M62830 Muscle spasm of back: Secondary | ICD-10-CM | POA: Diagnosis not present

## 2020-12-10 DIAGNOSIS — M9903 Segmental and somatic dysfunction of lumbar region: Secondary | ICD-10-CM | POA: Diagnosis not present

## 2021-01-13 ENCOUNTER — Other Ambulatory Visit: Payer: Self-pay | Admitting: Family

## 2021-01-13 DIAGNOSIS — I10 Essential (primary) hypertension: Secondary | ICD-10-CM

## 2021-01-16 DIAGNOSIS — M62830 Muscle spasm of back: Secondary | ICD-10-CM | POA: Diagnosis not present

## 2021-01-16 DIAGNOSIS — M5416 Radiculopathy, lumbar region: Secondary | ICD-10-CM | POA: Diagnosis not present

## 2021-01-16 DIAGNOSIS — M5136 Other intervertebral disc degeneration, lumbar region: Secondary | ICD-10-CM | POA: Diagnosis not present

## 2021-01-16 DIAGNOSIS — M9903 Segmental and somatic dysfunction of lumbar region: Secondary | ICD-10-CM | POA: Diagnosis not present

## 2021-03-04 ENCOUNTER — Encounter: Payer: Self-pay | Admitting: Family

## 2021-03-04 ENCOUNTER — Other Ambulatory Visit: Payer: Self-pay

## 2021-03-04 ENCOUNTER — Ambulatory Visit: Payer: Federal, State, Local not specified - PPO | Admitting: Family

## 2021-03-04 VITALS — BP 110/80 | HR 58 | Temp 96.2°F | Ht 75.0 in | Wt 243.4 lb

## 2021-03-04 DIAGNOSIS — I10 Essential (primary) hypertension: Secondary | ICD-10-CM

## 2021-03-04 DIAGNOSIS — F32A Depression, unspecified: Secondary | ICD-10-CM

## 2021-03-04 DIAGNOSIS — R634 Abnormal weight loss: Secondary | ICD-10-CM

## 2021-03-04 LAB — CBC WITH DIFFERENTIAL/PLATELET
Basophils Absolute: 0.1 10*3/uL (ref 0.0–0.1)
Basophils Relative: 1.2 % (ref 0.0–3.0)
Eosinophils Absolute: 0.1 10*3/uL (ref 0.0–0.7)
Eosinophils Relative: 2.3 % (ref 0.0–5.0)
HCT: 50.9 % (ref 39.0–52.0)
Hemoglobin: 17 g/dL (ref 13.0–17.0)
Lymphocytes Relative: 26.9 % (ref 12.0–46.0)
Lymphs Abs: 1.4 10*3/uL (ref 0.7–4.0)
MCHC: 33.4 g/dL (ref 30.0–36.0)
MCV: 84 fl (ref 78.0–100.0)
Monocytes Absolute: 0.5 10*3/uL (ref 0.1–1.0)
Monocytes Relative: 10.2 % (ref 3.0–12.0)
Neutro Abs: 3.1 10*3/uL (ref 1.4–7.7)
Neutrophils Relative %: 59.4 % (ref 43.0–77.0)
Platelets: 283 10*3/uL (ref 150.0–400.0)
RBC: 6.06 Mil/uL — ABNORMAL HIGH (ref 4.22–5.81)
RDW: 14.2 % (ref 11.5–15.5)
WBC: 5.2 10*3/uL (ref 4.0–10.5)

## 2021-03-04 LAB — COMPREHENSIVE METABOLIC PANEL
ALT: 22 U/L (ref 0–53)
AST: 19 U/L (ref 0–37)
Albumin: 4.6 g/dL (ref 3.5–5.2)
Alkaline Phosphatase: 67 U/L (ref 39–117)
BUN: 17 mg/dL (ref 6–23)
CO2: 31 mEq/L (ref 19–32)
Calcium: 9.8 mg/dL (ref 8.4–10.5)
Chloride: 100 mEq/L (ref 96–112)
Creatinine, Ser: 0.83 mg/dL (ref 0.40–1.50)
GFR: 100.72 mL/min (ref >60.00)
Glucose, Bld: 100 mg/dL — ABNORMAL HIGH (ref 70–99)
Potassium: 4.6 mEq/L (ref 3.5–5.1)
Sodium: 137 mEq/L (ref 135–145)
Total Bilirubin: 0.7 mg/dL (ref 0.2–1.2)
Total Protein: 6.8 g/dL (ref 6.0–8.3)

## 2021-03-04 LAB — LIPID PANEL
Cholesterol: 165 mg/dL (ref 0–200)
HDL: 35.9 mg/dL — ABNORMAL LOW (ref >39.00)
LDL Cholesterol: 113 mg/dL — ABNORMAL HIGH (ref 0–99)
NonHDL: 128.76
Total CHOL/HDL Ratio: 5
Triglycerides: 80 mg/dL (ref 0.0–149.0)
VLDL: 16 mg/dL (ref 0.0–40.0)

## 2021-03-04 LAB — VITAMIN D 25 HYDROXY (VIT D DEFICIENCY, FRACTURES): VITD: 50.37 ng/mL (ref 30.00–100.00)

## 2021-03-04 LAB — HEMOGLOBIN A1C: Hgb A1c MFr Bld: 6.1 % (ref 4.6–6.5)

## 2021-03-04 LAB — TSH: TSH: 1.15 u[IU]/mL (ref 0.35–5.50)

## 2021-03-04 NOTE — Patient Instructions (Addendum)
Please call to schedule with gastroenterology, Dr Bonna Gains  [phone]    let me know how you are doing and if we should discuss depression.

## 2021-03-04 NOTE — Progress Notes (Signed)
Subjective:    Patient ID: Bernard Blake, male    DOB: August 21, 1970, 50 y.o.   MRN: [id number]  CC: Bernard Blake is a 50 y.o. male who presents today for follow up.   HPI: He has been working on weight loss.  He is more active now and eating healthier diet, eating more protein and less carbs.  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  No fever, abdominal pain, night sweats, bone pain.   Works in [employer].   Here for follow-up after diverticulitis with Dr Bonna Gains in April of this year .  He is due for colonoscopy and willl call her office to schedule.   HTN-compliant with Lotensin 10 mg qpm, No cp, sob    Depression- he stopped wellbutrin 300mg , celexa 20mg  on his own 3-4 months. Overall he feels well without. There are days in which he feels down and feels related to weather. However he doesn't particularly miss medication and would like to continue without medication.   Sleeping well. No si/hi HISTORY:  Past Medical History:  Diagnosis Date   Allergy    Chicken pox    Depression    Diverticulitis    07/11/20   History of alcohol abuse    Hypertension    Low testosterone    Past Surgical History:  Procedure Laterality Date   ANKLE SURGERY Left    BB removal   COLONOSCOPY WITH PROPOFOL N/A 01/18/2020   Procedure: COLONOSCOPY WITH PROPOFOL;  Surgeon: Virgel Manifold, MD;  Location: [facility] ENDOSCOPY;  Service: Endoscopy;  Laterality: N/A;   Family History  Problem Relation Age of Onset   Arthritis Mother    Hyperlipidemia Mother    Hypertension Mother    Arthritis Father    Hyperlipidemia Father    Hypertension Father    Diabetes Father     Allergies: Tetanus toxoids Current Outpatient Medications on File Prior to Visit  Medication Sig Dispense Refill   Ascorbic Acid (VITAMIN C) 1000 MG tablet Take 2,000 mg by mouth daily.     benazepril (LOTENSIN) 10 MG tablet TAKE 1 TABLET(10 MG) BY MOUTH DAILY 90 tablet 2   buPROPion (WELLBUTRIN XL) 300 MG 24  hr tablet TAKE 1 TABLET(300 MG) BY MOUTH DAILY 90 tablet 1   Cetirizine HCl (ZYRTEC PO) Take by mouth.     cholecalciferol (VITAMIN D) 1000 UNITS tablet Take 2,000 Units by mouth daily.     citalopram (CELEXA) 20 MG tablet TAKE 2 TABLETS(40 MG) BY MOUTH DAILY 120 tablet 1   Co-Enzyme Q-10 100 MG CAPS Take 100 mg by mouth.     JATENZO 237 MG CAPS Take 1 capsule by mouth 2 (two) times daily.     Loratadine (CLARITIN PO) Take by mouth.     MAGNESIUM PO Take by mouth.     meloxicam (MOBIC) 15 MG tablet TAKE 1 TABLET(15 MG) BY MOUTH DAILY 30 tablet 3   Omega-3 Fatty Acids (FISH OIL) 1000 MG CAPS Take 2,400 mg by mouth once.     Probiotic Product (PROBIOTIC PO) Take by mouth.     selenium 200 MCG TABS tablet Take 200 mcg by mouth daily.     Zinc Sulfate 66 MG TABS Take 15 mg by mouth.     No current facility-administered medications on file prior to visit.    Social History   Tobacco Use   Smoking status: Former    Packs/day: 1.00    Years: 8.00    Pack years:  8.00    Types: Cigarettes   Smokeless tobacco: Current    Types: Snuff   Tobacco comments:    Quit smoking in 1995.  Substance Use Topics   Alcohol use: No    Alcohol/week: 0.0 standard drinks    Comment: Former alcoholic; now sober   Drug use: No    Review of Systems  Constitutional:  Negative for chills and fever.  HENT:  Negative for congestion.   Respiratory:  Negative for cough.   Cardiovascular:  Negative for chest pain, palpitations and leg swelling.  Gastrointestinal:  Negative for diarrhea, nausea and vomiting.  Musculoskeletal:  Negative for myalgias.  Skin:  Negative for rash.  Neurological:  Negative for headaches.  Hematological:  Negative for adenopathy.  Psychiatric/Behavioral:  Negative for confusion.      Objective:    BP 110/80    Pulse (!) 58    Temp (!) 96.2 F (35.7 C) (Skin)    Ht 6\' 3"  (1.905 m)    Wt 243 lb 6.4 oz (110.4 kg)    SpO2 98%    BMI 30.42 kg/m  BP Readings from Last 3 Encounters:   03/04/21 110/80  10/29/20 (!) 143/85  10/21/20 130/80   Wt Readings from Last 3 Encounters:  03/04/21 243 lb 6.4 oz (110.4 kg)  09/01/20 251 lb 9.6 oz (114.1 kg)  07/11/20 260 lb 9.6 oz (118.2 kg)    Physical Exam Vitals reviewed.  Constitutional:      Appearance: He is well-developed.  Cardiovascular:     Rate and Rhythm: Regular rhythm.     Heart sounds: Normal heart sounds.  Pulmonary:     Effort: Pulmonary effort is normal. No respiratory distress.     Breath sounds: Normal breath sounds. No wheezing, rhonchi or rales.  Skin:    General: Skin is warm and dry.  Neurological:     Mental Status: He is alert.  Psychiatric:        Speech: Speech normal.        Behavior: Behavior normal.       Assessment & Plan:   Problem List Items Addressed This Visit       Cardiovascular and Mediastinum   Essential hypertension - Primary    Chronic, stable.  Continue Lotensin 10 mg      Relevant Orders   TSH (Completed)   CBC with Differential/Platelet (Completed)   Comprehensive metabolic panel (Completed)   Hemoglobin A1c (Completed)   Lipid panel (Completed)   VITAMIN D 25 Hydroxy (Vit-D Deficiency, Fractures) (Completed)     Other   Depression    Chronic, stable.  Patient still discontinued Wellbutrin, celexa.  He politely declines restarting a low-dose of Celexa particularly as he continues to have days when he feels depressed in the winter months.  We will monitor this closely and let me know how he is doing      Weight loss    Intentional weight loss of approximately 17 pounds in the last 8 months.  We discussed his very physical line of work at [employer] as well as his side business landscaping, mowing lawns.  He has also been focused on healthier diet.  No B symptoms or alarm features at this time.  Advised that we should closely monitor this.  He will let me know if further concerns, or if weight loss unintentional.        I am having Bernard Blake maintain his  Fish Oil, vitamin C, cholecalciferol, Zinc Sulfate, Co-Enzyme Q-10, citalopram,  Jatenzo, selenium, buPROPion, meloxicam, MAGNESIUM PO, Cetirizine HCl (ZYRTEC PO), Loratadine (CLARITIN PO), Probiotic Product (PROBIOTIC PO), and benazepril.   No orders of the defined types were placed in this encounter.   Return precautions given.   Risks, benefits, and alternatives of the medications and treatment plan prescribed today were discussed, and patient expressed understanding.   Education regarding symptom management and diagnosis given to patient on AVS.  Continue to follow with [name], FNP for routine health maintenance.   Bernard Blake and I agreed with plan.   [name], FNP

## 2021-03-06 DIAGNOSIS — R634 Abnormal weight loss: Secondary | ICD-10-CM | POA: Insufficient documentation

## 2021-03-06 NOTE — Assessment & Plan Note (Signed)
Chronic, stable.  Patient still discontinued Wellbutrin, celexa.  He politely declines restarting a low-dose of Celexa particularly as he continues to have days when he feels depressed in the winter months.  We will monitor this closely and let me know how he is doing

## 2021-03-06 NOTE — Assessment & Plan Note (Signed)
Chronic, stable.  Continue Lotensin 10 mg

## 2021-03-06 NOTE — Assessment & Plan Note (Addendum)
Intentional weight loss of approximately 17 pounds in the last 8 months.  We discussed his very physical line of work at [employer] as well as his side business landscaping, mowing lawns.  He has also been focused on healthier diet.  No B symptoms or alarm features at this time.  Advised that we should closely monitor this.  He will let me know if further concerns, or if weight loss unintentional.

## 2021-03-10 ENCOUNTER — Other Ambulatory Visit: Payer: Self-pay

## 2021-03-10 DIAGNOSIS — R899 Unspecified abnormal finding in specimens from other organs, systems and tissues: Secondary | ICD-10-CM

## 2021-03-25 ENCOUNTER — Other Ambulatory Visit: Payer: Self-pay

## 2021-03-25 ENCOUNTER — Other Ambulatory Visit (INDEPENDENT_AMBULATORY_CARE_PROVIDER_SITE_OTHER): Payer: Federal, State, Local not specified - PPO

## 2021-03-25 DIAGNOSIS — R899 Unspecified abnormal finding in specimens from other organs, systems and tissues: Secondary | ICD-10-CM

## 2021-03-26 LAB — CBC WITH DIFFERENTIAL/PLATELET
Basophils Absolute: 0.1 10*3/uL (ref 0.0–0.1)
Basophils Relative: 1.3 % (ref 0.0–3.0)
Eosinophils Absolute: 0.2 10*3/uL (ref 0.0–0.7)
Eosinophils Relative: 2 % (ref 0.0–5.0)
HCT: 51 % (ref 39.0–52.0)
Hemoglobin: 17 g/dL (ref 13.0–17.0)
Lymphocytes Relative: 23.5 % (ref 12.0–46.0)
Lymphs Abs: 1.9 10*3/uL (ref 0.7–4.0)
MCHC: 33.4 g/dL (ref 30.0–36.0)
MCV: 84.3 fl (ref 78.0–100.0)
Monocytes Absolute: 0.8 10*3/uL (ref 0.1–1.0)
Monocytes Relative: 9.3 % (ref 3.0–12.0)
Neutro Abs: 5.2 10*3/uL (ref 1.4–7.7)
Neutrophils Relative %: 63.9 % (ref 43.0–77.0)
Platelets: 287 10*3/uL (ref 150.0–400.0)
RBC: 6.05 Mil/uL — ABNORMAL HIGH (ref 4.22–5.81)
RDW: 14.3 % (ref 11.5–15.5)
WBC: 8.1 10*3/uL (ref 4.0–10.5)

## 2021-03-31 ENCOUNTER — Other Ambulatory Visit: Payer: Self-pay

## 2021-03-31 DIAGNOSIS — R899 Unspecified abnormal finding in specimens from other organs, systems and tissues: Secondary | ICD-10-CM

## 2021-05-19 ENCOUNTER — Other Ambulatory Visit (INDEPENDENT_AMBULATORY_CARE_PROVIDER_SITE_OTHER): Payer: Federal, State, Local not specified - PPO

## 2021-05-19 ENCOUNTER — Other Ambulatory Visit: Payer: Self-pay

## 2021-05-19 DIAGNOSIS — R899 Unspecified abnormal finding in specimens from other organs, systems and tissues: Secondary | ICD-10-CM | POA: Diagnosis not present

## 2021-05-19 DIAGNOSIS — N5201 Erectile dysfunction due to arterial insufficiency: Secondary | ICD-10-CM | POA: Diagnosis not present

## 2021-05-19 DIAGNOSIS — N401 Enlarged prostate with lower urinary tract symptoms: Secondary | ICD-10-CM | POA: Diagnosis not present

## 2021-05-19 DIAGNOSIS — Z79899 Other long term (current) drug therapy: Secondary | ICD-10-CM | POA: Diagnosis not present

## 2021-05-19 DIAGNOSIS — E291 Testicular hypofunction: Secondary | ICD-10-CM | POA: Diagnosis not present

## 2021-05-19 LAB — CBC WITH DIFFERENTIAL/PLATELET
Basophils Absolute: 0.1 10*3/uL (ref 0.0–0.1)
Basophils Relative: 1.1 % (ref 0.0–3.0)
Eosinophils Absolute: 0.1 10*3/uL (ref 0.0–0.7)
Eosinophils Relative: 2.4 % (ref 0.0–5.0)
HCT: 47.8 % (ref 39.0–52.0)
Hemoglobin: 15.9 g/dL (ref 13.0–17.0)
Lymphocytes Relative: 28.1 % (ref 12.0–46.0)
Lymphs Abs: 1.6 10*3/uL (ref 0.7–4.0)
MCHC: 33.3 g/dL (ref 30.0–36.0)
MCV: 84.7 fl (ref 78.0–100.0)
Monocytes Absolute: 0.6 10*3/uL (ref 0.1–1.0)
Monocytes Relative: 10.5 % (ref 3.0–12.0)
Neutro Abs: 3.3 10*3/uL (ref 1.4–7.7)
Neutrophils Relative %: 57.9 % (ref 43.0–77.0)
Platelets: 253 10*3/uL (ref 150.0–400.0)
RBC: 5.65 Mil/uL (ref 4.22–5.81)
RDW: 14.4 % (ref 11.5–15.5)
WBC: 5.6 10*3/uL (ref 4.0–10.5)

## 2021-07-08 ENCOUNTER — Ambulatory Visit: Payer: Federal, State, Local not specified - PPO | Admitting: Family

## 2021-07-08 ENCOUNTER — Encounter: Payer: Self-pay | Admitting: Family

## 2021-07-08 VITALS — BP 126/78 | HR 66 | Temp 97.5°F | Ht 74.0 in | Wt 254.4 lb

## 2021-07-08 DIAGNOSIS — I10 Essential (primary) hypertension: Secondary | ICD-10-CM | POA: Diagnosis not present

## 2021-07-08 DIAGNOSIS — F32A Depression, unspecified: Secondary | ICD-10-CM

## 2021-07-08 DIAGNOSIS — R634 Abnormal weight loss: Secondary | ICD-10-CM

## 2021-07-08 LAB — POCT GLYCOSYLATED HEMOGLOBIN (HGB A1C): Hemoglobin A1C: 5.4 % (ref 4.0–5.6)

## 2021-07-08 NOTE — Progress Notes (Signed)
? ?Subjective:  ? ? Patient ID: Bernard Blake, male    DOB: [birth year], 51 y.o.   MRN: [id number] ? ?CC: Bernard Blake is a 51 y.o. male who presents today for follow up.  ? ?HPI: Feels well today ?No new complaints ? ? ?He has gained weight from previous visit. He continues to follow low carb diet generally.  ? ?HTN- he has stopped taking lotensin '10mg'$ . ? ?Depression- he is no longer on celexa or wellbutrin as didn't feel he needed. He feels that in warmer months, depression improves with warmer weather ?No concerns for memory changes or trouble concentrating. Sleeping well.  ? ?He is taking testosterone, Jatenzo through urology. Dr Bernard Blake with [facility]. He states that Dr Bernard Blake also checks his PSA ( unable to records in the chart) ? ?HISTORY:  ?Past Medical History:  ?Diagnosis Date  ? Allergy   ? Chicken pox   ? Depression   ? Diverticulitis   ? 07/11/20  ? History of alcohol abuse   ? Hypertension   ? Low testosterone   ? ?Past Surgical History:  ?Procedure Laterality Date  ? ANKLE SURGERY Left   ? BB removal  ? COLONOSCOPY WITH PROPOFOL N/A 01/18/2020  ? Procedure: COLONOSCOPY WITH PROPOFOL;  Surgeon: Bernard Manifold, MD;  Location: [facility] ENDOSCOPY;  Service: Endoscopy;  Laterality: N/A;  ? ?Family History  ?Problem Relation Age of Onset  ? Arthritis Mother   ? Hyperlipidemia Mother   ? Hypertension Mother   ? Arthritis Father   ? Hyperlipidemia Father   ? Hypertension Father   ? Diabetes Father   ? ? ?Allergies: Tetanus toxoids ?Current Outpatient Medications on File Prior to Visit  ?Medication Sig Dispense Refill  ? Ascorbic Acid (VITAMIN C) 1000 MG tablet Take 2,000 mg by mouth daily.    ? Cetirizine HCl (ZYRTEC PO) Take by mouth.    ? cholecalciferol (VITAMIN D) 1000 UNITS tablet Take 2,000 Units by mouth daily.    ? Co-Enzyme Q-10 100 MG CAPS Take 100 mg by mouth.    ? JATENZO 237 MG CAPS Take 1 capsule by mouth 2 (two) times daily.    ? Loratadine (CLARITIN PO) Take by mouth.    ? MAGNESIUM PO  Take by mouth.    ? Omega-3 Fatty Acids (FISH OIL) 1000 MG CAPS Take 2,400 mg by mouth once.    ? selenium 200 MCG TABS tablet Take 200 mcg by mouth daily.    ? Zinc Sulfate 66 MG TABS Take 15 mg by mouth.    ? ?No current facility-administered medications on file prior to visit.  ? ? ?Social History  ? ?Tobacco Use  ? Smoking status: Former  ?  Packs/day: 1.00  ?  Years: 8.00  ?  Pack years: 8.00  ?  Types: Cigarettes  ? Smokeless tobacco: Current  ?  Types: Snuff  ? Tobacco comments:  ?  Quit smoking in 1995.  ?Substance Use Topics  ? Alcohol use: No  ?  Alcohol/week: 0.0 standard drinks  ?  Comment: Former alcoholic; now sober  ? Drug use: No  ? ? ?Review of Systems  ?Constitutional:  Negative for chills and fever.  ?Respiratory:  Negative for cough.   ?Cardiovascular:  Negative for chest pain and palpitations.  ?Gastrointestinal:  Negative for nausea and vomiting.  ?   ?Objective:  ?  ?BP 126/78 (BP Location: Left Arm, Patient Position: Sitting, Cuff Size: Normal)   Pulse 66   Temp (!) 97.5 ?F (  36.4 ?C) (Oral)   Ht '6\' 2"'$  (1.88 m)   Wt 254 lb 6.4 oz (115.4 kg)   SpO2 98%   BMI 32.66 kg/m?  ?BP Readings from Last 3 Encounters:  ?07/08/21 126/78  ?03/04/21 110/80  ?10/29/20 (!) 143/85  ? ?Wt Readings from Last 3 Encounters:  ?07/08/21 254 lb 6.4 oz (115.4 kg)  ?03/04/21 243 lb 6.4 oz (110.4 kg)  ?09/01/20 251 lb 9.6 oz (114.1 kg)  ? ? ?Physical Exam ?Vitals reviewed.  ?Constitutional:   ?   Appearance: He is well-developed.  ?Cardiovascular:  ?   Rate and Rhythm: Regular rhythm.  ?   Heart sounds: Normal heart sounds.  ?Pulmonary:  ?   Effort: Pulmonary effort is normal. No respiratory distress.  ?   Breath sounds: Normal breath sounds. No wheezing, rhonchi or rales.  ?Skin: ?   General: Skin is warm and dry.  ?Neurological:  ?   Mental Status: He is alert.  ?Psychiatric:     ?   Speech: Speech normal.     ?   Behavior: Behavior normal.  ? ? ?   ?Assessment & Plan:  ? ?Problem List Items Addressed This Visit    ? ?  ? Cardiovascular and Mediastinum  ? Essential hypertension - Primary  ?  Chronic , stable without lotensin '10mg'$ . Advised to spot check BP at home. Will follow ? ?  ?  ? Relevant Orders  ? POCT HgB A1C (Completed)  ?  ? Other  ? Depression  ?  Stable at this time. He doesn't feel medication necessary and is off wellbutrin, celexa. Will continue to follow.  ? ?  ?  ? Weight loss  ?  Resolved.  A1c improved. Will continue to follow ? ?  ?  ? ? ? ?I have discontinued Bernard Blake's citalopram, buPROPion, meloxicam, Probiotic Product (PROBIOTIC PO), and benazepril. I am also having him maintain his Fish Oil, vitamin C, cholecalciferol, Zinc Sulfate, Co-Enzyme Q-10, Jatenzo, selenium, MAGNESIUM PO, Cetirizine HCl (ZYRTEC PO), and Loratadine (CLARITIN PO). ? ? ?No orders of the defined types were placed in this encounter. ? ? ?Return precautions given.  ? ?Risks, benefits, and alternatives of the medications and treatment plan prescribed today were discussed, and patient expressed understanding.  ? ?Education regarding symptom management and diagnosis given to patient on AVS. ? ?Continue to follow with Bernard Hawthorne, FNP for routine health maintenance.  ? ?Bernard Blake and I agreed with plan.  ? ?Bernard Paris, FNP ? ? ?

## 2021-07-08 NOTE — Assessment & Plan Note (Signed)
Chronic , stable without lotensin '10mg'$ . Advised to spot check BP at home. Will follow ?

## 2021-07-08 NOTE — Patient Instructions (Signed)
Nice to you see you ?Please continue to monitor blood pressure, goal < 120/80 ? ? ?

## 2021-07-08 NOTE — Assessment & Plan Note (Signed)
Stable at this time. He doesn't feel medication necessary and is off wellbutrin, celexa. Will continue to follow.  ?

## 2021-07-08 NOTE — Assessment & Plan Note (Addendum)
Resolved.  A1c improved. Will continue to follow ?

## 2021-08-19 DIAGNOSIS — M5416 Radiculopathy, lumbar region: Secondary | ICD-10-CM | POA: Diagnosis not present

## 2021-08-19 DIAGNOSIS — M5136 Other intervertebral disc degeneration, lumbar region: Secondary | ICD-10-CM | POA: Diagnosis not present

## 2021-08-19 DIAGNOSIS — M62830 Muscle spasm of back: Secondary | ICD-10-CM | POA: Diagnosis not present

## 2021-08-19 DIAGNOSIS — M9903 Segmental and somatic dysfunction of lumbar region: Secondary | ICD-10-CM | POA: Diagnosis not present

## 2021-09-30 DIAGNOSIS — M5416 Radiculopathy, lumbar region: Secondary | ICD-10-CM | POA: Diagnosis not present

## 2021-09-30 DIAGNOSIS — M5136 Other intervertebral disc degeneration, lumbar region: Secondary | ICD-10-CM | POA: Diagnosis not present

## 2021-09-30 DIAGNOSIS — M9903 Segmental and somatic dysfunction of lumbar region: Secondary | ICD-10-CM | POA: Diagnosis not present

## 2021-09-30 DIAGNOSIS — M62830 Muscle spasm of back: Secondary | ICD-10-CM | POA: Diagnosis not present

## 2021-11-03 DIAGNOSIS — M5416 Radiculopathy, lumbar region: Secondary | ICD-10-CM | POA: Diagnosis not present

## 2021-11-03 DIAGNOSIS — M5136 Other intervertebral disc degeneration, lumbar region: Secondary | ICD-10-CM | POA: Diagnosis not present

## 2021-11-03 DIAGNOSIS — M9903 Segmental and somatic dysfunction of lumbar region: Secondary | ICD-10-CM | POA: Diagnosis not present

## 2021-11-03 DIAGNOSIS — M62830 Muscle spasm of back: Secondary | ICD-10-CM | POA: Diagnosis not present

## 2021-12-07 DIAGNOSIS — N5201 Erectile dysfunction due to arterial insufficiency: Secondary | ICD-10-CM | POA: Diagnosis not present

## 2021-12-07 DIAGNOSIS — E291 Testicular hypofunction: Secondary | ICD-10-CM | POA: Diagnosis not present

## 2021-12-07 DIAGNOSIS — Z79899 Other long term (current) drug therapy: Secondary | ICD-10-CM | POA: Diagnosis not present

## 2021-12-31 DIAGNOSIS — D2272 Melanocytic nevi of left lower limb, including hip: Secondary | ICD-10-CM | POA: Diagnosis not present

## 2021-12-31 DIAGNOSIS — M9903 Segmental and somatic dysfunction of lumbar region: Secondary | ICD-10-CM | POA: Diagnosis not present

## 2021-12-31 DIAGNOSIS — M5416 Radiculopathy, lumbar region: Secondary | ICD-10-CM | POA: Diagnosis not present

## 2021-12-31 DIAGNOSIS — D2262 Melanocytic nevi of left upper limb, including shoulder: Secondary | ICD-10-CM | POA: Diagnosis not present

## 2021-12-31 DIAGNOSIS — M5136 Other intervertebral disc degeneration, lumbar region: Secondary | ICD-10-CM | POA: Diagnosis not present

## 2021-12-31 DIAGNOSIS — M62830 Muscle spasm of back: Secondary | ICD-10-CM | POA: Diagnosis not present

## 2021-12-31 DIAGNOSIS — D2261 Melanocytic nevi of right upper limb, including shoulder: Secondary | ICD-10-CM | POA: Diagnosis not present

## 2021-12-31 DIAGNOSIS — D225 Melanocytic nevi of trunk: Secondary | ICD-10-CM | POA: Diagnosis not present

## 2022-01-13 ENCOUNTER — Encounter: Payer: Self-pay | Admitting: Family

## 2022-01-13 ENCOUNTER — Ambulatory Visit: Payer: Federal, State, Local not specified - PPO | Admitting: Family

## 2022-01-13 ENCOUNTER — Ambulatory Visit (INDEPENDENT_AMBULATORY_CARE_PROVIDER_SITE_OTHER): Payer: Federal, State, Local not specified - PPO

## 2022-01-13 VITALS — BP 128/76 | HR 82 | Temp 98.3°F | Ht 74.0 in | Wt 257.0 lb

## 2022-01-13 DIAGNOSIS — M25552 Pain in left hip: Secondary | ICD-10-CM

## 2022-01-13 DIAGNOSIS — I10 Essential (primary) hypertension: Secondary | ICD-10-CM | POA: Diagnosis not present

## 2022-01-13 DIAGNOSIS — M25551 Pain in right hip: Secondary | ICD-10-CM | POA: Diagnosis not present

## 2022-01-13 MED ORDER — MELOXICAM 7.5 MG PO TABS: 7.5000 mg | ORAL_TABLET | Freq: Every day | ORAL | 1 refills | Status: DC | PRN

## 2022-01-13 NOTE — Assessment & Plan Note (Addendum)
Symptoms consistent with trochanteric bursitis.  Discussed conservative management including meloxicam as needed.  Referral to physical therapy.  Pending baseline bilateral hip x-ray in setting of groin pain

## 2022-01-13 NOTE — Patient Instructions (Addendum)
Referral for physical therapy  Let us know if you dont hear back within a week in regards to an appointment being scheduled.   So that you are aware, if you are [facility] MyChart user , please pay attention to your MyChart messages as you may receive a MyChart message with a phone number to call and schedule this test/appointment own your own from our referral coordinator. This is a new process so I do not want you to miss this message.  If you are not a MyChart user, you will receive a phone call.    Trial of mobic  A couple of points in regards to meloxicam ( Mobic) -  This medication is not intended for daily , long term use. It is a potent anti inflammatory ( NSAID), and my intention is for you take as needed for moderate to severe pain. If you find yourself using daily, please let me know.   Please takes Mobic ( meloxicam) with FOOD since it is an anti-inflammatory as it can cause a GI bleed or ulcer. If you have a history of GI bleed or ulcer, please do NOT take.  Do no take over the counter aleve, motrin, advil, goody's powder for pain as they are also NSAIDs, and they are  in the same class as Mobic  Lastly, we will need to monitor kidney function while on Mobic, and if we were to see any decline in kidney function in the future, we would have to discontinue this medication.  Hip Bursitis Rehab Ask your health care provider which exercises are safe for you. Do exercises exactly as told by your health care provider and adjust them as directed. It is normal to feel mild stretching, pulling, tightness, or discomfort as you do these exercises. Stop right away if you feel sudden pain or your pain gets worse. Do not begin these exercises until told by your health care provider. Stretching exercise This exercise warms up your muscles and joints and improves the movement and flexibility of your hip. This exercise also helps to relieve pain and stiffness. Iliotibial band stretch An iliotibial band is  a strong band of muscle tissue that runs from the outer side of your hip to the outer side of your thigh and knee. Lie on your side with your left / right leg in the top position. Bend your left / right knee and grab your ankle. Stretch out your bottom arm to help you balance. Slowly bring your knee back so your thigh is slightly behind your body. Slowly lower your knee toward the floor until you feel a gentle stretch on the outside of your left / right thigh. If you do not feel a stretch and your knee will not lower more toward the floor, place the heel of your other foot on top of your knee and pull your knee down toward the floor with your foot. Hold this position for __________ seconds. Slowly return to the starting position. Repeat __________ times. Complete this exercise __________ times a day. Strengthening exercises These exercises build strength and endurance in your hip and pelvis. Endurance is the ability to use your muscles for a long time, even after they get tired. Bridge This exercise strengthens the muscles that move your thigh backward (hip extensors). Lie on your back on a firm surface with your knees bent and your feet flat on the floor. Tighten your buttocks muscles and lift your buttocks off the floor until your trunk is level with your thighs.  Do not arch your back. You should feel the muscles working in your buttocks and the back of your thighs. If you do not feel these muscles, slide your feet 1-2 inches (2.5-5 cm) farther away from your buttocks. If this exercise is too easy, try doing it with your arms crossed over your chest. Hold this position for __________ seconds. Slowly lower your hips to the starting position. Let your muscles relax completely after each repetition. Repeat __________ times. Complete this exercise __________ times a day. Squats This exercise strengthens the muscles in front of your thigh and knee (quadriceps). Stand in front of a table, with  your feet and knees pointing straight ahead. You may rest your hands on the table for balance but not for support. Slowly bend your knees and lower your hips like you are going to sit in a chair. Keep your weight over your heels, not over your toes. Keep your lower legs upright so they are parallel with the table legs. Do not let your hips go lower than your knees. Do not bend lower than told by your health care provider. If your hip pain increases, do not bend as low. Hold the squat position for __________ seconds. Slowly push with your legs to return to standing. Do not use your hands to pull yourself to standing. Repeat __________ times. Complete this exercise __________ times a day. Hip hike  Stand sideways on a bottom step. Stand on your left / right leg with your other foot unsupported next to the step. You can hold on to the railing or wall for balance if needed. Keep your knees straight and your torso square. Then lift your left / right hip up toward the ceiling. Hold this position for __________ seconds. Slowly let your left / right hip lower toward the floor, past the starting position. Your foot should get closer to the floor. Do not lean or bend your knees. Repeat __________ times. Complete this exercise __________ times a day. Single leg stand This exercise increases your balance. Without shoes, stand near a railing or in a doorway. You may hold on to the railing or door frame as needed for balance. Squeeze your left / right buttock muscles, then lift up your other foot. Do not let your left / right hip push out to the side. It is helpful to stand in front of a mirror for this exercise so you can watch your hip. Hold this position for __________ seconds. Repeat __________ times. Complete this exercise __________ times a day. This information is not intended to replace advice given to you by your health care provider. Make sure you discuss any questions you have with your health  care provider. Document Revised: 02/18/2021 Document Reviewed: 02/18/2021 Elsevier Patient Education  [place].

## 2022-01-13 NOTE — Progress Notes (Signed)
Subjective:    Patient ID: Bernard Blake, male    DOB: 25-Feb-1971, 51 y.o.   MRN: [id number]  CC: Bernard Blake is a 51 y.o. male who presents today for follow up.   HPI: Complains of episodic left lateral hip pain radiating to left knee x 2 weeks.   Painful to sleep on left hip  Pain radiates to the groin.  He went bowling for first time in 10 years and onset of pain during bowling 6 weeks ago, which eased off.   Tried ice pack, ibuprofen will some relief.   He is going to chiropractor currently.   He is playing disc golf, mowing grass. He doesn't feel he needs to return to wellbutrin. He plans to return to gym. No si/hi.     No  trouble urinating, saddle anesthesia, flank pain, numbness.   Very rare low back pain which is not particularly bothersome.    Hypertension-he is no longer taking Lotensin 10 mg   HISTORY:  Past Medical History:  Diagnosis Date   Allergy    Chicken pox    Depression    Diverticulitis    07/11/20   History of alcohol abuse    Hypertension    Low testosterone    Past Surgical History:  Procedure Laterality Date   ANKLE SURGERY Left    BB removal   COLONOSCOPY WITH PROPOFOL N/A 01/18/2020   Procedure: COLONOSCOPY WITH PROPOFOL;  Surgeon: Virgel Manifold, MD;  Location: [facility] ENDOSCOPY;  Service: Endoscopy;  Laterality: N/A;   Family History  Problem Relation Age of Onset   Arthritis Mother    Hyperlipidemia Mother    Hypertension Mother    Arthritis Father    Hyperlipidemia Father    Hypertension Father    Diabetes Father     Allergies: Tetanus toxoids Current Outpatient Medications on File Prior to Visit  Medication Sig Dispense Refill   Ascorbic Acid (VITAMIN C) 1000 MG tablet Take 2,000 mg by mouth daily.     Cetirizine HCl (ZYRTEC PO) Take by mouth.     cholecalciferol (VITAMIN D) 1000 UNITS tablet Take 2,000 Units by mouth daily.     Co-Enzyme Q-10 100 MG CAPS Take 100 mg by mouth.     JATENZO 237 MG CAPS Take 1  capsule by mouth 2 (two) times daily.     Loratadine (CLARITIN PO) Take by mouth.     MAGNESIUM PO Take by mouth.     Omega-3 Fatty Acids (FISH OIL) 1000 MG CAPS Take 2,400 mg by mouth once.     selenium 200 MCG TABS tablet Take 200 mcg by mouth daily.     Zinc Sulfate 66 MG TABS Take 15 mg by mouth.     No current facility-administered medications on file prior to visit.    Social History   Tobacco Use   Smoking status: Former    Packs/day: 1.00    Years: 8.00    Total pack years: 8.00    Types: Cigarettes   Smokeless tobacco: Current    Types: Snuff   Tobacco comments:    Quit smoking in 1995.  Substance Use Topics   Alcohol use: No    Alcohol/week: 0.0 standard drinks of alcohol    Comment: Former alcoholic; now sober   Drug use: No    Review of Systems  Constitutional:  Negative for chills and fever.  Respiratory:  Negative for cough.   Cardiovascular:  Negative for chest pain and palpitations.  Gastrointestinal:  Negative for nausea and vomiting.  Genitourinary:  Negative for difficulty urinating.  Musculoskeletal:  Positive for arthralgias. Negative for back pain.      Objective:    BP 128/76 (BP Location: Left Arm, Patient Position: Sitting, Cuff Size: Normal)   Pulse 82   Temp 98.3 F (36.8 C) (Oral)   Ht '6\' 2"'$  (1.88 m)   Wt 257 lb (116.6 kg)   SpO2 96%   BMI 33.00 kg/m  BP Readings from Last 3 Encounters:  01/13/22 128/76  07/08/21 126/78  03/04/21 110/80   Wt Readings from Last 3 Encounters:  01/13/22 257 lb (116.6 kg)  07/08/21 254 lb 6.4 oz (115.4 kg)  03/04/21 243 lb 6.4 oz (110.4 kg)    Physical Exam Vitals reviewed.  Constitutional:      Appearance: He is well-developed.  Cardiovascular:     Rate and Rhythm: Regular rhythm.     Heart sounds: Normal heart sounds.  Pulmonary:     Effort: Pulmonary effort is normal. No respiratory distress.     Breath sounds: Normal breath sounds. No wheezing or rales.  Musculoskeletal:     Lumbar  back: No swelling, spasms or tenderness. Normal range of motion.     Comments: Full range of motion with flexion, extension, lateral side bends. No pain, numbness, tingling elicited with single leg raise bilaterally. No rash.  Right Hip: No limp or waddling gait. Full ROM with flexion and hip rotation in flexion.    No pain of lateral hip with  (flexion-abduction-external rotation) test.   No pain with deep palpation of greater trochanter.     Skin:    General: Skin is warm and dry.  Neurological:     Mental Status: He is alert.  Psychiatric:        Speech: Speech normal.        Behavior: Behavior normal.        Assessment & Plan:   Problem List Items Addressed This Visit       Cardiovascular and Mediastinum   Essential hypertension    Blood pressure well controlled today without medication.  He was previously on Lotensin 10 mg        Other   Left hip pain - Primary    Symptoms consistent with trochanteric bursitis.  Discussed conservative management including meloxicam as needed.  Referral to physical therapy.  Pending baseline bilateral hip x-ray in setting of groin pain      Relevant Medications   meloxicam (MOBIC) 7.5 MG tablet   Other Relevant Orders   DG HIPS BILAT W OR W/O PELVIS 3-4 VIEWS   Ambulatory referral to Physical Therapy     I am having Bernard Blake start on meloxicam. I am also having him maintain his Fish Oil, vitamin C, cholecalciferol, Zinc Sulfate, Co-Enzyme Q-10, Jatenzo, selenium, MAGNESIUM PO, Cetirizine HCl (ZYRTEC PO), and Loratadine (CLARITIN PO).   Meds ordered this encounter  Medications   meloxicam (MOBIC) 7.5 MG tablet    Sig: Take 1 tablet (7.5 mg total) by mouth daily as needed for pain.    Dispense:  30 tablet    Refill:  1    Order Specific Question:   Supervising Provider    Answer:   Crecencio Mc [[id number]]    Return precautions given.   Risks, benefits, and alternatives of the medications and treatment plan prescribed  today were discussed, and patient expressed understanding.   Education regarding symptom management and diagnosis given to patient on AVS.  Continue to follow with [name], FNP for routine health maintenance.   Bernard Blake and I agreed with plan.   [name], FNP

## 2022-01-13 NOTE — Assessment & Plan Note (Signed)
Blood pressure well controlled today without medication.  He was previously on Lotensin 10 mg

## 2022-01-20 ENCOUNTER — Encounter: Payer: Self-pay | Admitting: Family

## 2022-01-25 ENCOUNTER — Other Ambulatory Visit: Payer: Self-pay | Admitting: Family

## 2022-01-25 ENCOUNTER — Encounter: Payer: Self-pay | Admitting: Family

## 2022-01-25 DIAGNOSIS — M25552 Pain in left hip: Secondary | ICD-10-CM

## 2022-01-25 NOTE — Telephone Encounter (Signed)
Pt called stating he sent a mychart message regarding having an MRI done

## 2022-01-26 ENCOUNTER — Other Ambulatory Visit: Payer: Self-pay | Admitting: Family

## 2022-01-26 DIAGNOSIS — M25552 Pain in left hip: Secondary | ICD-10-CM

## 2022-01-29 ENCOUNTER — Other Ambulatory Visit: Payer: Self-pay | Admitting: Family

## 2022-01-29 DIAGNOSIS — F32A Depression, unspecified: Secondary | ICD-10-CM

## 2022-01-29 DIAGNOSIS — M25552 Pain in left hip: Secondary | ICD-10-CM

## 2022-01-29 MED ORDER — MELOXICAM 7.5 MG PO TABS: 7.5000 mg | ORAL_TABLET | Freq: Every day | ORAL | 2 refills | Status: DC | PRN

## 2022-01-29 MED ORDER — BUPROPION HCL ER (XL) 150 MG PO TB24: 150.0000 mg | ORAL_TABLET | Freq: Every day | ORAL | 1 refills | Status: DC

## 2022-01-29 MED ORDER — CITALOPRAM HYDROBROMIDE 20 MG PO TABS: 20.0000 mg | ORAL_TABLET | Freq: Every day | ORAL | 3 refills | Status: DC

## 2022-01-29 NOTE — Telephone Encounter (Signed)
Rx sent patient is aware

## 2022-01-31 ENCOUNTER — Ambulatory Visit
Admission: RE | Admit: 2022-01-31 | Discharge: 2022-01-31 | Disposition: A | Discharge status: Home / Self Care | Payer: Federal, State, Local not specified - PPO | Source: Ambulatory Visit | Attending: Family | Admitting: Family

## 2022-01-31 DIAGNOSIS — M25552 Pain in left hip: Secondary | ICD-10-CM

## 2022-01-31 DIAGNOSIS — M25562 Pain in left knee: Secondary | ICD-10-CM | POA: Diagnosis not present

## 2022-01-31 DIAGNOSIS — M25452 Effusion, left hip: Secondary | ICD-10-CM | POA: Diagnosis not present

## 2022-01-31 DIAGNOSIS — M65852 Other synovitis and tenosynovitis, left thigh: Secondary | ICD-10-CM | POA: Diagnosis not present

## 2022-01-31 DIAGNOSIS — M2410 Other articular cartilage disorders, unspecified site: Secondary | ICD-10-CM | POA: Diagnosis not present

## 2022-01-31 DIAGNOSIS — M1612 Unilateral primary osteoarthritis, left hip: Secondary | ICD-10-CM | POA: Diagnosis not present

## 2022-02-05 ENCOUNTER — Other Ambulatory Visit: Payer: Self-pay | Admitting: Family

## 2022-02-05 ENCOUNTER — Telehealth: Payer: Self-pay | Admitting: Family

## 2022-02-05 DIAGNOSIS — M25552 Pain in left hip: Secondary | ICD-10-CM

## 2022-02-05 NOTE — Telephone Encounter (Signed)
Spoke to pt about his lab appt and informed him that Joycelyn Schmid said he did not need one until his CPE. Lab appt is for Xray

## 2022-02-05 NOTE — Telephone Encounter (Signed)
Patient has a lab appt 02/09/2022 there are no orders in.

## 2022-02-09 ENCOUNTER — Ambulatory Visit (INDEPENDENT_AMBULATORY_CARE_PROVIDER_SITE_OTHER): Payer: Federal, State, Local not specified - PPO

## 2022-02-09 ENCOUNTER — Other Ambulatory Visit: Payer: Federal, State, Local not specified - PPO

## 2022-02-09 DIAGNOSIS — M25552 Pain in left hip: Secondary | ICD-10-CM | POA: Diagnosis not present

## 2022-02-09 DIAGNOSIS — M62830 Muscle spasm of back: Secondary | ICD-10-CM | POA: Diagnosis not present

## 2022-02-09 DIAGNOSIS — M5416 Radiculopathy, lumbar region: Secondary | ICD-10-CM | POA: Diagnosis not present

## 2022-02-09 DIAGNOSIS — M9903 Segmental and somatic dysfunction of lumbar region: Secondary | ICD-10-CM | POA: Diagnosis not present

## 2022-02-09 DIAGNOSIS — M25551 Pain in right hip: Secondary | ICD-10-CM | POA: Diagnosis not present

## 2022-02-09 DIAGNOSIS — M545 Low back pain, unspecified: Secondary | ICD-10-CM | POA: Diagnosis not present

## 2022-02-09 DIAGNOSIS — M5136 Other intervertebral disc degeneration, lumbar region: Secondary | ICD-10-CM | POA: Diagnosis not present

## 2022-02-16 DIAGNOSIS — M7602 Gluteal tendinitis, left hip: Secondary | ICD-10-CM | POA: Diagnosis not present

## 2022-02-24 ENCOUNTER — Ambulatory Visit: Payer: Federal, State, Local not specified - PPO | Attending: Family

## 2022-02-24 DIAGNOSIS — M25552 Pain in left hip: Secondary | ICD-10-CM | POA: Insufficient documentation

## 2022-02-24 DIAGNOSIS — R262 Difficulty in walking, not elsewhere classified: Secondary | ICD-10-CM | POA: Insufficient documentation

## 2022-02-24 DIAGNOSIS — M6281 Muscle weakness (generalized): Secondary | ICD-10-CM | POA: Insufficient documentation

## 2022-02-24 NOTE — Therapy (Signed)
[facility] [address], [place], [address] Phone: [phone]   Fax:  [phone]  Physical Therapy Evaluation  Patient Details  Name: Bernard Blake MRN: [id number] Date of Birth: 01/24/1971 Referring Provider (PT): [name], FNP   Encounter Date: 02/24/2022   PT End of Session - 02/24/22 1722     Visit Number 1    Number of Visits 17    Date for PT Re-Evaluation 04/22/22    PT Start Time 1722    PT Stop Time 1828    PT Time Calculation (min) 66 min    Equipment Utilized During Treatment Gait belt    Activity Tolerance Patient tolerated treatment well    Behavior During Therapy [facility] for tasks assessed/performed             Past Medical History:  Diagnosis Date   Allergy    Chicken pox    Depression    Diverticulitis    07/11/20   History of alcohol abuse    Hypertension    Low testosterone     Past Surgical History:  Procedure Laterality Date   ANKLE SURGERY Left    BB removal   COLONOSCOPY WITH PROPOFOL N/A 01/18/2020   Procedure: COLONOSCOPY WITH PROPOFOL;  Surgeon: Virgel Manifold, MD;  Location: [facility] ENDOSCOPY;  Service: Endoscopy;  Laterality: N/A;    There were no vitals filed for this visit.    Subjective Assessment - 02/24/22 1727     Subjective L hip: 2/10 currently (pt sitting on a chair), 9/10 at worst for the past 3 months.    Pertinent History L hip pain. Pain began about 5 weeks ago. Had similar pain months back. Pain started at the top and at the side of his L hip joint. Saw his PCP for a routine checkup and told her about hip pain. 5 months ago, pt was bowling and might have stepped the wrong way affecting his L knee. Was given meloxicam which helped. Feels like his L LE is restless, especially at night. Pt feels L lateral hip and thigh pain running down to his L knee (along IT band and L5 patheway). Just finished his prednisone which helped. 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    Patient Stated Goals Get back to function with no pain.    Currently in Pain? Yes    Pain Location Hip    Pain Orientation Left    Pain Descriptors / Indicators Restless;Tender;Aching;Dull;Radiating    Pain Type Chronic pain    Pain Onset More than a month ago    Pain Frequency Occasional    Aggravating Factors  L Side lying (keeps him from sleeping). Prolonged sitting and driving. Pressing on that area. Excess walking.    Pain Relieving Factors Meloxicam, prednisone. Crossing his L leg on his R. Hip IR in sitting.                [facility] PT Assessment - 02/24/22 1723       Assessment   Medical Diagnosis M25.552 (ICD-10-CM) - Left hip pain    Referring Provider (PT) [name], FNP    Onset Date/Surgical Date 02/05/22   Date PT referral signed.   Prior Therapy Chriopractic treatment which did not help his condition      Precautions   Precaution Comments No known precautions      Restrictions   Other Position/Activity Restrictions No  known restrictions      Balance Screen   Has the patient fallen in the past 6 months No    Has the patient had a decrease in activity level because of a fear of falling?  No    Is the patient reluctant to leave their home because of a fear of falling?  No      Prior Function   Level of Independence Independent    Vocation Full time employment   [employer]     Observation/Other Assessments   Focus on Therapeutic Outcomes (FOTO)  L hip FOTO 57      Posture/Postural Control   Posture Comments forward neck, B protracted shoulders, R shoulder slightly lower, R thoracolumbar convexity. Movement crease around L2/L3, L3/L4.      AROM   Lumbar Flexion full   to the L: No pain; to the R: no pain   Lumbar Extension WFL   to the L: no pain; to the R, no pain   Lumbar - Right Side Bend WFL    Lumbar - Left Side Bend WFL    Lumbar - Right Rotation [facility]    Lumbar - Left Rotation [facility]       Strength   Right Hip Flexion 4/5    Right Hip Extension 3+/5    Right Hip External Rotation  4-/5    Right Hip Internal Rotation 4-/5    Right Hip ABduction 4-/5    Left Hip Flexion 4/5   with L hip discomfort. Difficult to perform compared to R hip flexion   Left Hip Extension 3+/5    Left Hip External Rotation 4-/5   Possible L hip discomfort.   Left Hip Internal Rotation 4-/5   with reproduction of L hip pain   Left Hip ABduction 4-/5   with symptoms.   Right Knee Flexion 4+/5    Right Knee Extension 5/5    Left Knee Flexion 5/5    Left Knee Extension 5/5      Palpation   Palpation comment TTP  L greater trochanter around distal insertion of glute med muscle.                        Objective measurements completed on examination: See above findings.   No latex allergies Blood pressure controlled per pt.  L ankle surgery March 09, 2021 (BB removal)  Gets chiropractic treatment monthly which does not help.   (-) repeated flexion test   TTP  L greater trochanter around distal insertion of glute med muscle.    Therapeutic exercise  Hip hip abduction clamshell isometrics at 40% effort 1 min with 1 min rest breaks 5x  (To be performed 3x/day)   Standing hip abduction isometrics, feet shoulder width apart 10x5 seconds   Pt education on equal weight bearing when standing so as to not place excessive stress onto L hip. Pt education on R S/L sleeping with pillow between knees to maintain neutral thighs to decrease stress to L greater trochanter muscle insertion points and bursa. Recommended egg shell crate mattress topper to help decrease pressure to L hip if pt switches to L S/L while sleeping. Pt verbalized understanding.     Improved exercise technique, movement at target joints, use of target muscles after mod verbal, visual, tactile cues.     Response to treatment Fair tolerance to today's session.     Clinical impression Pt is a 51 year old male  who came to physical  therapy secondary to L hip pain. He also presents with L greater trochanter TTP, reproduction of symptoms with seated L hip IR as well as S/L L hip abduction, demonstrates bilateral hip weakness, and difficulty performing tasks which involve prolonged walking, sitting, as well as difficulty sleeping on his L side secondary to pain. Signs and symptoms suggest glute med and min tendon involvement. Pt will benefit from skilled physical therapy services to address the aforementioned deficits.        Home exercise program: Supine hip abduction clamshell isometrics at 40% effort 1 min with 1 min rest breaks 5x  (To be performed 3x/day)   Standing hip abduction isometrics, feet shoulder width apart 10x5 seconds 3 sets of 10 throughout the day.  Pain should not be greater than 3/10 if there is one. Pt verbalized understanding.   Pt education on equal weight bearing when standing so as to not place excessive stress onto L hip. Pt education on R S/L sleeping with pillow between knees to maintain neutral thighs to decrease stress to L greater trochanter muscle insertion points and bursa. Recommended egg shell crate mattress topper to help decrease pressure to L hip if pt switches to L S/L while sleeping. Pt verbalized understanding.                      PT Short Term Goals - 02/24/22 1845       PT SHORT TERM GOAL #1   Title Pt will be independent with his initial HEP to decrease pain, improve strength, function, ability to perform work tasks more comfortably.    Baseline Pt has started his initial HEP (02/24/2022)    Time 3    Period Weeks    Status New    Target Date 03/18/22               PT Long Term Goals - 02/24/22 1846       PT LONG TERM GOAL #1   Title Pt will have a decrease in L hip pain to 3/10 or less at worst to promote ability to sit and drive for work, ambulate longer distances, and sleep on his L side more comfortably.    Baseline 9/10  L hip pain at worst for the past 3 months (02/24/2022)    Time 8    Period Weeks    Status New    Target Date 04/22/22      PT LONG TERM GOAL #2   Title Pt will improve his hip FOTO score by at least 10 points as a demonstration of improved function.    Baseline L hip FOTO 57 (02/24/2022)    Time 8    Period Weeks    Status New    Target Date 04/22/22      PT LONG TERM GOAL #3   Title Pt will improve bilateral hip extension and abduction strength by at least 1/2 MMT grade to promote ability to perform standing tasks as well as ambulate longer distances for work more comfortably.    Baseline Hip extension 3+/5 R, 3+/5 L, hip abduction 4-/5 R, 4-/5 L (with symptoms) 02/24/2022.    Time 8    Period Weeks    Status New    Target Date 04/22/22                    Plan - 02/24/22 1841     Clinical Impression Statement Pt is a 51 year old male who came  to physical therapy secondary to L hip pain. He also presents with L greater trochanter TTP, reproduction of symptoms with seated L hip IR as well as S/L L hip abduction, demonstrates bilateral hip weakness, and difficulty performing tasks which involve prolonged walking, sitting, as well as difficulty sleeping on his L side secondary to pain. Signs and symptoms suggest glute med and min tendon involvement. Pt will benefit from skilled physical therapy services to address the aforementioned deficits.    Personal Factors and Comorbidities Comorbidity 2;Profession    Comorbidities Depression, HTN    Examination-Activity Limitations Sit;Sleep;Locomotion Level    Stability/Clinical Decision Making Stable/Uncomplicated    Clinical Decision Making Low    Rehab Potential Fair    PT Frequency 2x / week    PT Duration 8 weeks    PT Treatment/Interventions Therapeutic exercise;Neuromuscular re-education;Therapeutic activities;Manual techniques;Patient/family education;Dry needling;Aquatic Therapy;Electrical Stimulation;Iontophoresis '4mg'$ /ml  Dexamethasone    PT Next Visit Plan Glute med and min isometrics, then concentric, then eccentric muscle actvation. Manual techniques, modalities PRN    Consulted and Agree with Plan of Care Patient             Patient will benefit from skilled therapeutic intervention in order to improve the following deficits and impairments:  Pain, Postural dysfunction, Improper body mechanics, Difficulty walking, Decreased strength  Visit Diagnosis: Pain in left hip - Plan: PT plan of care cert/re-cert  Muscle weakness (generalized) - Plan: PT plan of care cert/re-cert  Difficulty in walking, not elsewhere classified - Plan: PT plan of care cert/re-cert     Problem List Patient Active Problem List   Diagnosis Date Noted   Left hip pain 01/13/2022   Weight loss 03/06/2021   Diverticulitis 07/14/2020   Encounter for screening colonoscopy    Polyp of sigmoid colon    Plantar fasciitis 07/03/2018   Acute non-recurrent maxillary sinusitis 04/26/2018   Routine physical examination 09/15/2016   Essential hypertension 03/12/2015   Depression 03/12/2015   Low testosterone 03/12/2015   Obesity (BMI 30.0-34.9) 03/12/2015   Joneen Boers PT, DPT  02/24/2022, 7:02 PM  [facility] [address] [place], [address] Phone: [phone]   Fax:  [phone]  Name: Treyshaun Keatts MRN: [id number] Date of Birth: 09/20/1970

## 2022-03-02 ENCOUNTER — Ambulatory Visit: Payer: Federal, State, Local not specified - PPO | Admitting: Physical Therapy

## 2022-03-02 ENCOUNTER — Encounter: Payer: Self-pay | Admitting: Physical Therapy

## 2022-03-02 DIAGNOSIS — M25552 Pain in left hip: Secondary | ICD-10-CM

## 2022-03-02 DIAGNOSIS — R262 Difficulty in walking, not elsewhere classified: Secondary | ICD-10-CM

## 2022-03-02 DIAGNOSIS — M6281 Muscle weakness (generalized): Secondary | ICD-10-CM

## 2022-03-02 NOTE — Therapy (Unsigned)
OUTPATIENT PHYSICAL THERAPY TREATMENT NOTE   Patient Name: Bernard Blake MRN: [id number] DOB:1970/09/20, 51 y.o., male Today's Date: 03/02/2022  PCP: [name], FNP  REFERRING PROVIDER: [name], FNP  END OF SESSION:  PT End of Session - 03/02/22 1744     Visit Number 2    Number of Visits 17    Date for PT Re-Evaluation 04/22/22    Authorization Type [employer] reporting period from 03/02/2022    Authorization Time Period VL: 50 PT/OT/ST - 71 remain    Authorization - Visit Number 2    Authorization - Number of Visits 82    PT Start Time 1103    PT Stop Time 1812    PT Time Calculation (min) 40 min    Equipment Utilized During Treatment Gait belt    Activity Tolerance Patient tolerated treatment well    Behavior During Therapy WFL for tasks assessed/performed             Past Medical History:  Diagnosis Date   Allergy    Chicken pox    Depression    Diverticulitis    07/11/20   History of alcohol abuse    Hypertension    Low testosterone    Past Surgical History:  Procedure Laterality Date   ANKLE SURGERY Left    BB removal   COLONOSCOPY WITH PROPOFOL N/A 01/18/2020   Procedure: COLONOSCOPY WITH PROPOFOL;  Surgeon: Virgel Manifold, MD;  Location: [facility] ENDOSCOPY;  Service: Endoscopy;  Laterality: N/A;   Patient Active Problem List   Diagnosis Date Noted   Left hip pain 01/13/2022   Weight loss 03/06/2021   Diverticulitis 07/14/2020   Encounter for screening colonoscopy    Polyp of sigmoid colon    Plantar fasciitis 07/03/2018   Acute non-recurrent maxillary sinusitis 04/26/2018   Routine physical examination 09/15/2016   Essential hypertension 03/12/2015   Depression 03/12/2015   Low testosterone 03/12/2015   Obesity (BMI 30.0-34.9) 03/12/2015    REFERRING DIAG: left hip pain   THERAPY DIAG:  Pain in left hip  Muscle weakness (generalized)  Difficulty in walking, not elsewhere classified  Rationale for  Evaluation and Treatment: Rehabilitation  PERTINENT HISTORY: L hip pain. Pain began about 5 weeks prior to initial PT eval. Had similar pain months back. Pain started at the top and at the side of his L hip joint. Saw his PCP for a routine checkup and told her about hip pain. 5 months ago, pt was bowling and might have stepped the wrong way affecting his L knee. Was given meloxicam which helped. Feels like his L LE is restless, especially at night. Pt feels L lateral hip and thigh pain running down to his L knee (along IT band and L5 patheway). Just finished his prednisone which helped. 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   PRECAUTIONS: none  SUBJECTIVE:   SUBJECTIVE STATEMENT: Patient states he has been feeling a bit better since initial eval. He has been doing his HEP for about 1x a day. He has some discomfort at the lateral hips for a while after each time he does it. He had a longer day than usual yesterday. He was dragging after yesterday. He has had some sinus symptoms and he thought that was related.   PAIN:  Are you having pain? NPRS left proximal glute med region.    TODAY'S TREATMENT:      Therapeutic exercise: to centralize symptoms and  improve ROM, strength, muscular endurance, and activity tolerance required for successful completion of functional activities.  - review of HEP with hooklying isometric hip abduction against strap and standing isometric hip abduction.  - hooklying bridge with isometric hip abduction against strap at distal thighs, 1x20 with 5 second holds. Progressed to with Black TB 1x3 with 5 second hold.  - standing lateral step keeping feet hip width apart, 1x20 feet each direction with RTB around ankles.  - standing forwards/backwards monster walks, 1x20 feet each direction with RTB around ankles.  - standing hip extension at hip machine at level 4 height and rotary position 3 machine 110 lbs 2x10 each side.  - update of HEP and  continued education about positioning in standing and in bed.   Pt required multimodal cuing for proper technique and to facilitate improved neuromuscular control, strength, range of motion, and functional ability resulting in improved performance and form.  PATIENT EDUCATION: Education details: Exercise purpose/form. Self management techniques. Education on diagnosis, prognosis, POC, anatomy and physiology of current condition Education on HEP including handout  Reviewed cancelation/no-show policy with patient and confirmed patient has correct phone number for clinic; patient verbalized understanding (03/02/22). Person educated: Patient Education method: Explanation, Corporate treasurer cues, Verbal cues, and Handouts Education comprehension: verbalized understanding, returned demonstration, verbal cues required, and needs further education  HOME EXERCISE PROGRAM: HOME EXERCISE PROGRAM [[email or web address]] view at "[email or web address]" using code: [email or web address]  Hip Abduction Isometric -  Repeat 10 Times, Hold 5 Seconds, Perform 3 Times a Day  BRIDGING ELASTIC BAND ABDUCTION -  Repeat 10 Times, Hold 5 Seconds, Complete 2 Sets, Perform 1 Times a Day   PT Short Term Goals       PT SHORT TERM GOAL #1   Title Pt will be independent with his initial HEP to decrease pain, improve strength, function, ability to perform work tasks more comfortably.    Baseline Pt has started his initial HEP (02/24/2022)    Time 3    Period Weeks    Status MET 03/02/2022   Target Date 03/18/22              PT Long Term Goals TARGET DATE FOR ALL LONG TERM GOALS: 04/22/2022      PT LONG TERM GOAL #1   Title Pt will have a decrease in L hip pain to 3/10 or less at worst to promote ability to sit and drive for work, ambulate longer distances, and sleep on his L side more comfortably.    Baseline 9/10 L hip pain at worst for the past 3 months (02/24/2022)    Time 8    Period Weeks    Status In-Progress   Target Date 04/22/22       PT LONG TERM GOAL #2   Title Pt will improve his hip FOTO score by at least 10 points as a demonstration of improved function.    Baseline L hip FOTO 57 (02/24/2022)    Time 8    Period Weeks    Status In-progress   Target Date      PT LONG TERM GOAL #3   Title Pt will improve bilateral hip extension and abduction strength by at least 1/2 MMT grade to promote ability to perform standing tasks as well as ambulate longer distances for work more comfortably.    Baseline Hip extension 3+/5 R, 3+/5 L, hip abduction 4-/5 R, 4-/5 L (with symptoms) (02/24/2022).    Time 8    Period Weeks    Status  In-Progress   Target Date              Plan -    Clinical Impression Statement Patient arrives with good tolerance to initial HEP. Today's session focused on gentle progressions of hip strengthening exercises with good tolerance. Plan to continue with progressions as tolerated next session and consider dry needling and/or manual therapy as appropriate. Patient would benefit from continued management of limiting condition by skilled physical therapist to address remaining impairments and functional limitations to work towards stated goals and return to PLOF or maximal functional independence.    Personal Factors and Comorbidities Comorbidity 2;Profession    Comorbidities Depression, HTN    Examination-Activity Limitations Sit;Sleep;Locomotion Level    Stability/Clinical Decision Making Stable/Uncomplicated    Rehab Potential Fair    PT Frequency 2x / week    PT Duration 8 weeks    PT Treatment/Interventions Therapeutic exercise;Neuromuscular re-education;Therapeutic activities;Manual techniques;Patient/family education;Dry needling;Aquatic Therapy;Electrical Stimulation;Iontophoresis 21m/ml Dexamethasone    PT Next Visit Plan Glute med and min isometrics, then concentric, then eccentric muscle actvation. Manual techniques, modalities PRN    Consulted and Agree with Plan of Care Patient               SNancy Nordmann PT, DPT 03/02/2022, 6:03 PM  [facility] [address] [id number]: [phone] F: [phone]

## 2022-03-07 DIAGNOSIS — J209 Acute bronchitis, unspecified: Secondary | ICD-10-CM | POA: Diagnosis not present

## 2022-03-07 DIAGNOSIS — J019 Acute sinusitis, unspecified: Secondary | ICD-10-CM | POA: Diagnosis not present

## 2022-03-07 DIAGNOSIS — B9689 Other specified bacterial agents as the cause of diseases classified elsewhere: Secondary | ICD-10-CM | POA: Diagnosis not present

## 2022-03-09 ENCOUNTER — Encounter: Payer: Self-pay | Admitting: Physical Therapy

## 2022-03-09 ENCOUNTER — Ambulatory Visit: Payer: Federal, State, Local not specified - PPO | Admitting: Physical Therapy

## 2022-03-09 DIAGNOSIS — M6281 Muscle weakness (generalized): Secondary | ICD-10-CM | POA: Diagnosis not present

## 2022-03-09 DIAGNOSIS — M25552 Pain in left hip: Secondary | ICD-10-CM | POA: Diagnosis not present

## 2022-03-09 DIAGNOSIS — R262 Difficulty in walking, not elsewhere classified: Secondary | ICD-10-CM | POA: Diagnosis not present

## 2022-03-09 NOTE — Therapy (Signed)
OUTPATIENT PHYSICAL THERAPY TREATMENT NOTE   Patient Name: Bernard Blake MRN: [id number] DOB:14-Aug-1970, 51 y.o., male Today's Date: 03/09/2022  PCP: [name], FNP  REFERRING PROVIDER: [name], FNP  END OF SESSION:  PT End of Session - 03/09/22 1824     Visit Number 3    Number of Visits 17    Date for PT Re-Evaluation 04/22/22    Authorization Type [employer] reporting period from 03/02/2022    Authorization Time Period VL: 50 PT/OT/ST - 6 remain    Authorization - Number of Visits 46    Progress Note Due on Visit 10    PT Start Time 1822    PT Stop Time 1900    PT Time Calculation (min) 38 min    Equipment Utilized During Treatment Gait belt    Activity Tolerance Patient tolerated treatment well    Behavior During Therapy WFL for tasks assessed/performed              Past Medical History:  Diagnosis Date   Allergy    Chicken pox    Depression    Diverticulitis    07/11/20   History of alcohol abuse    Hypertension    Low testosterone    Past Surgical History:  Procedure Laterality Date   ANKLE SURGERY Left    BB removal   COLONOSCOPY WITH PROPOFOL N/A 01/18/2020   Procedure: COLONOSCOPY WITH PROPOFOL;  Surgeon: Virgel Manifold, MD;  Location: [facility] ENDOSCOPY;  Service: Endoscopy;  Laterality: N/A;   Patient Active Problem List   Diagnosis Date Noted   Left hip pain 01/13/2022   Weight loss 03/06/2021   Diverticulitis 07/14/2020   Encounter for screening colonoscopy    Polyp of sigmoid colon    Plantar fasciitis 07/03/2018   Acute non-recurrent maxillary sinusitis 04/26/2018   Routine physical examination 09/15/2016   Essential hypertension 03/12/2015   Depression 03/12/2015   Low testosterone 03/12/2015   Obesity (BMI 30.0-34.9) 03/12/2015    REFERRING DIAG: left hip pain   THERAPY DIAG:  Pain in left hip  Muscle weakness (generalized)  Difficulty in walking, not elsewhere classified  Rationale for  Evaluation and Treatment: Rehabilitation  PERTINENT HISTORY: L hip pain. Pain began about 5 weeks prior to initial PT eval. Had similar pain months back. Pain started at the top and at the side of his L hip joint. Saw his PCP for a routine checkup and told her about hip pain. 5 months ago, pt was bowling and might have stepped the wrong way affecting his L knee. Was given meloxicam which helped. Feels like his L LE is restless, especially at night. Pt feels L lateral hip and thigh pain running down to his L knee (along IT band and L5 patheway). Just finished his prednisone which helped. 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   PRECAUTIONS: none  SUBJECTIVE:   SUBJECTIVE STATEMENT: Patient reports his hips are feeling better. He was not too sore after last PT session. He has been doing his HEP every day except for two days. He had a respiratory infection. He took yesterday off work because of that but he is feeling better.   PAIN:  Are you having pain? NPRS 0/10 left proximal glute med region.    TODAY'S TREATMENT:      Therapeutic exercise: to centralize symptoms and improve ROM, strength, muscular endurance, and activity tolerance required for successful completion of functional activities.  -  Treadmill 2 mph at 0% grade with intermittent UE support. For improved lower extremity mobility, muscular endurance, and weightbearing activity tolerance; and to induce the analgesic effect of aerobic exercise, stimulate improved joint nutrition, and prepare body structures and systems for following interventions. x 5 minutes. Required assistance to set up machine. - standing lateral step keeping feet hip width apart, 3x20 feet each direction with BlackTB around ankles.  - standing forwards/backwards monster walks, 3x20 feet each direction with BlackTB around ankles.  - hip hikes/drops with one foot on 2x4 board/step, 3x10 each side with B UE support.  - back elevated glute  thruster, 3x10 AROM/20/30# - Education on HEP including handout   Pt required multimodal cuing for proper technique and to facilitate improved neuromuscular control, strength, range of motion, and functional ability resulting in improved performance and form.  PATIENT EDUCATION: Education details: Exercise purpose/form. Self management techniques. Education on HEP including handout  Reviewed cancelation/no-show policy with patient and confirmed patient has correct phone number for clinic; patient verbalized understanding (03/02/22). Person educated: Patient Education method: Explanation, Corporate treasurer cues, Verbal cues, and Handouts Education comprehension: verbalized understanding, returned demonstration, verbal cues required, and needs further education  HOME EXERCISE PROGRAM: Access Code: [email or web address] URL: [email or web address] Date: 03/09/2022 Prepared by: Rosita Kea  Exercises - Hip Hikes off step  - 1 x daily - 3 sets - 10 reps  HOME EXERCISE PROGRAM [[email or web address]] view at "[email or web address]" using code: [email or web address]  Hip Abduction Isometric -  Repeat 10 Times, Hold 5 Seconds, Perform 3 Times a Day  BRIDGING ELASTIC BAND ABDUCTION -  Repeat 10 Times, Hold 5 Seconds, Complete 2 Sets, Perform 1 Times a Day   PT Short Term Goals       PT SHORT TERM GOAL #1   Title Pt will be independent with his initial HEP to decrease pain, improve strength, function, ability to perform work tasks more comfortably.    Baseline Pt has started his initial HEP (02/24/2022)    Time 3    Period Weeks    Status MET 03/02/2022   Target Date 03/18/22              PT Long Term Goals TARGET DATE FOR ALL LONG TERM GOALS: 04/22/2022      PT LONG TERM GOAL #1   Title Pt will have a decrease in L hip pain to 3/10 or less at worst to promote ability to sit and drive for work, ambulate longer distances, and sleep on his L side more comfortably.    Baseline 9/10 L hip pain at worst for the past 3  months (02/24/2022)    Time 8    Period Weeks    Status In-Progress   Target Date 04/22/22      PT LONG TERM GOAL #2   Title Pt will improve his hip FOTO score by at least 10 points as a demonstration of improved function.    Baseline L hip FOTO 57 (02/24/2022)    Time 8    Period Weeks    Status In-progress   Target Date      PT LONG TERM GOAL #3   Title Pt will improve bilateral hip extension and abduction strength by at least 1/2 MMT grade to promote ability to perform standing tasks as well as ambulate longer distances for work more comfortably.    Baseline Hip extension 3+/5 R, 3+/5 L, hip abduction 4-/5 R, 4-/5 L (with symptoms) (02/24/2022).    Time 8    Period Weeks  Status In-Progress   Target Date              Plan -    Clinical Impression Statement Patient arrives with good tolerance to last session. Continued with progressive loading focusing on bilateral lateral hips. Patient felt strong fatigue in lateral hips that improves with rest, suggesting appropriate loading. Plan to progress exercises as appropriate depending on response over time by next PT session. Patient would benefit from continued management of limiting condition by skilled physical therapist to address remaining impairments and functional limitations to work towards stated goals and return to PLOF or maximal functional independence.    Personal Factors and Comorbidities Comorbidity 2;Profession    Comorbidities Depression, HTN    Examination-Activity Limitations Sit;Sleep;Locomotion Level    Stability/Clinical Decision Making Stable/Uncomplicated    Rehab Potential Fair    PT Frequency 2x / week    PT Duration 8 weeks    PT Treatment/Interventions Therapeutic exercise;Neuromuscular re-education;Therapeutic activities;Manual techniques;Patient/family education;Dry needling;Aquatic Therapy;Electrical Stimulation;Iontophoresis 45m/ml Dexamethasone    PT Next Visit Plan Glute med and min isometrics, then  concentric, then eccentric muscle actvation. Manual techniques, modalities PRN    Consulted and Agree with Plan of Care Patient              SNancy Nordmann PT, DPT 03/09/2022, 7:21 PM  [facility] [address] [address]P: [phone] F: [phone]

## 2022-03-24 ENCOUNTER — Ambulatory Visit: Payer: Federal, State, Local not specified - PPO | Attending: Family | Admitting: Physical Therapy

## 2022-03-24 ENCOUNTER — Encounter: Payer: Self-pay | Admitting: Physical Therapy

## 2022-03-24 DIAGNOSIS — M25552 Pain in left hip: Secondary | ICD-10-CM

## 2022-03-24 DIAGNOSIS — M6281 Muscle weakness (generalized): Secondary | ICD-10-CM | POA: Diagnosis not present

## 2022-03-24 DIAGNOSIS — R262 Difficulty in walking, not elsewhere classified: Secondary | ICD-10-CM | POA: Insufficient documentation

## 2022-03-24 NOTE — Therapy (Signed)
OUTPATIENT PHYSICAL THERAPY TREATMENT NOTE   Patient Name: Bernard Blake MRN: [id number] DOB:[birth year], 52 y.o., male Today's Date: 03/24/2022  PCP: [name], FNP  REFERRING PROVIDER: [name], FNP  END OF SESSION:  PT End of Session - 03/24/22 1915     Visit Number 4    Number of Visits 17    Date for PT Re-Evaluation 04/22/22    Authorization Type [employer] reporting period from 03/02/2022    Authorization Time Period VL: 23 PT/OT/ST - 21 remain    Authorization - Visit Number 3    Authorization - Number of Visits 46    Progress Note Due on Visit 10    PT Start Time 1909    PT Stop Time 1947    PT Time Calculation (min) 38 min    Equipment Utilized During Treatment Gait belt    Activity Tolerance Patient tolerated treatment well    Behavior During Therapy WFL for tasks assessed/performed               Past Medical History:  Diagnosis Date   Allergy    Chicken pox    Depression    Diverticulitis    07/11/20   History of alcohol abuse    Hypertension    Low testosterone    Past Surgical History:  Procedure Laterality Date   ANKLE SURGERY Left    BB removal   COLONOSCOPY WITH PROPOFOL N/A 01/18/2020   Procedure: COLONOSCOPY WITH PROPOFOL;  Surgeon: Virgel Manifold, MD;  Location: [facility] ENDOSCOPY;  Service: Endoscopy;  Laterality: N/A;   Patient Active Problem List   Diagnosis Date Noted   Left hip pain 01/13/2022   Weight loss 03/06/2021   Diverticulitis 07/14/2020   Encounter for screening colonoscopy    Polyp of sigmoid colon    Plantar fasciitis 07/03/2018   Acute non-recurrent maxillary sinusitis 04/26/2018   Routine physical examination 09/15/2016   Essential hypertension 03/12/2015   Depression 03/12/2015   Low testosterone 03/12/2015   Obesity (BMI 30.0-34.9) 03/12/2015    REFERRING DIAG: left hip pain   THERAPY DIAG:  Pain in left hip  Muscle weakness (generalized)  Difficulty in walking, not  elsewhere classified  Rationale for Evaluation and Treatment: Rehabilitation  PERTINENT HISTORY: L hip pain. Pain began about 5 weeks prior to initial PT eval. Had similar pain months back. Pain started at the top and at the side of his L hip joint. Saw his PCP for a routine checkup and told her about hip pain. 5 months ago, pt was bowling and might have stepped the wrong way affecting his L knee. Was given meloxicam which helped. Feels like his L LE is restless, especially at night. Pt feels L lateral hip and thigh pain running down to his L knee (along IT band and L5 patheway). Just finished his prednisone which helped. 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   PRECAUTIONS: none  SUBJECTIVE:   SUBJECTIVE STATEMENT: Patient reports his hip has been doing great until about two nights ago. It has been about a week since his prednisone taper. Tow nights ago it felt like his left knee was hurting across his distal quad. It came and went repeatedly. When he went to bed that night he didn't take anything or put anything on it. His hip ached but his knee hurt. He was tired but he laid in bed for 1.5 hours tossing. He took a melatonin last night  and he went straight to sleep.  Other Than that he has not had the pain like he has had before. He has been a little lax on his HEP. He has done them 3-4 days worth of exercise since his last PT session. He was taking prednisone for a sinus infection. He has been having very little pain today but not bad. Sitting in the truck bothers him the most. He didn't have pain after last PT session.   PAIN:  Are you having pain? NPRS 0/10 left proximal glute med region.    TODAY'S TREATMENT:      Therapeutic exercise: to centralize symptoms and improve ROM, strength, muscular endurance, and activity tolerance required for successful completion of functional activities.  - Treadmill 2 mph at 0% grade with intermittent UE support. For improved  lower extremity mobility, muscular endurance, and weightbearing activity tolerance; and to induce the analgesic effect of aerobic exercise, stimulate improved joint nutrition, and prepare body structures and systems for following interventions. x 5 minutes. Required assistance to set up machine.  Superset:  - standing lateral step keeping feet at least hip width apart, 3x20 feet each direction with BlackTB around ankles.  - standing forwards/backwards monster walks, 3x20 feet each direction with BlackTB around ankles.  - hip hikes/drops with one foot on step, 3x10 each side with UE support.  - lateral walk with cable attached to ankle cuff on second LE, 3 steps each way x10 each side, 10#.  Pt required multimodal cuing for proper technique and to facilitate improved neuromuscular control, strength, range of motion, and functional ability resulting in improved performance and form.  PATIENT EDUCATION: Education details: Exercise purpose/form. Self management techniques. Education on HEP including handout  Reviewed cancelation/no-show policy with patient and confirmed patient has correct phone number for clinic; patient verbalized understanding (03/02/22). Person educated: Patient Education method: Explanation, Corporate treasurer cues, Verbal cues, and Handouts Education comprehension: verbalized understanding, returned demonstration, verbal cues required, and needs further education  HOME EXERCISE PROGRAM: Access Code: [email or web address] URL: [email or web address] Date: 03/09/2022 Prepared by: Rosita Kea  Exercises - Hip Hikes off step  - 1 x daily - 3 sets - 10 reps  HOME EXERCISE PROGRAM [[email or web address]] view at "[email or web address]" using code: [email or web address]  Hip Abduction Isometric -  Repeat 10 Times, Hold 5 Seconds, Perform 3 Times a Day  BRIDGING ELASTIC BAND ABDUCTION -  Repeat 10 Times, Hold 5 Seconds, Complete 2 Sets, Perform 1 Times a Day   PT Short Term Goals       PT SHORT TERM GOAL #1    Title Pt will be independent with his initial HEP to decrease pain, improve strength, function, ability to perform work tasks more comfortably.    Baseline Pt has started his initial HEP (02/24/2022)    Time 3    Period Weeks    Status MET 03/02/2022   Target Date 03/18/22              PT Long Term Goals TARGET DATE FOR ALL LONG TERM GOALS: 04/22/2022      PT LONG TERM GOAL #1   Title Pt will have a decrease in L hip pain to 3/10 or less at worst to promote ability to sit and drive for work, ambulate longer distances, and sleep on his L side more comfortably.    Baseline 9/10 L hip pain at worst for the past 3 months (02/24/2022)    Time 8    Period Weeks    Status In-Progress  Target Date 04/22/22      PT LONG TERM GOAL #2   Title Pt will improve his hip FOTO score by at least 10 points as a demonstration of improved function.    Baseline L hip FOTO 57 (02/24/2022)    Time 8    Period Weeks    Status In-progress   Target Date      PT LONG TERM GOAL #3   Title Pt will improve bilateral hip extension and abduction strength by at least 1/2 MMT grade to promote ability to perform standing tasks as well as ambulate longer distances for work more comfortably.    Baseline Hip extension 3+/5 R, 3+/5 L, hip abduction 4-/5 R, 4-/5 L (with symptoms) (02/24/2022).    Time 8    Period Weeks    Status In-Progress   Target Date              Plan -    Clinical Impression Statement Patient arrives with report of improved hip pain except recent episode of pain that kept him up at night. He has been doing HEP intermittently with last time possibly related to flair in symptoms so today's session continued with similar level of interventions. Did add light hip adduction exercise to improve hip stability and strength. Patient tolerated interventions well with report of burning in bilateral lateral hips (R > L) that resolved with rest. Patient continues to require PT guidance and cuing for  correct completion of exercises and optimal dosing. Patient would benefit from continued management of limiting condition by skilled physical therapist to address remaining impairments and functional limitations to work towards stated goals and return to PLOF or maximal functional independence.     Personal Factors and Comorbidities Comorbidity 2;Profession    Comorbidities Depression, HTN    Examination-Activity Limitations Sit;Sleep;Locomotion Level    Stability/Clinical Decision Making Stable/Uncomplicated    Rehab Potential Fair    PT Frequency 2x / week    PT Duration 8 weeks    PT Treatment/Interventions Therapeutic exercise;Neuromuscular re-education;Therapeutic activities;Manual techniques;Patient/family education;Dry needling;Aquatic Therapy;Electrical Stimulation;Iontophoresis 12m/ml Dexamethasone    PT Next Visit Plan Glute med and min isometrics, then concentric, then eccentric muscle actvation. Manual techniques, modalities PRN    Consulted and Agree with Plan of Care Patient              SNancy Nordmann PT, DPT 03/24/2022, 8:03 PM  [facility] [address] [place] [id number]: [phone] F: [phone]

## 2022-03-29 ENCOUNTER — Ambulatory Visit: Payer: Federal, State, Local not specified - PPO | Admitting: Physical Therapy

## 2022-03-29 ENCOUNTER — Encounter: Payer: Self-pay | Admitting: Physical Therapy

## 2022-03-29 DIAGNOSIS — R262 Difficulty in walking, not elsewhere classified: Secondary | ICD-10-CM | POA: Diagnosis not present

## 2022-03-29 DIAGNOSIS — M25552 Pain in left hip: Secondary | ICD-10-CM

## 2022-03-29 DIAGNOSIS — M6281 Muscle weakness (generalized): Secondary | ICD-10-CM | POA: Diagnosis not present

## 2022-03-29 NOTE — Therapy (Signed)
OUTPATIENT PHYSICAL THERAPY TREATMENT NOTE   Patient Name: Bernard Blake MRN: [id number] DOB:[birth year], 52 y.o., male Today's Date: 03/29/2022  PCP: [name], FNP  REFERRING PROVIDER: [name], FNP  END OF SESSION:  PT End of Session - 03/29/22 1907     Visit Number 5    Number of Visits 17    Date for PT Re-Evaluation 04/22/22    Authorization Type [employer] reporting period from 03/02/2022    Authorization Time Period VL: 39 PT/OT/ST - 95 remain    Authorization - Visit Number 4    Authorization - Number of Visits 46    Progress Note Due on Visit 10    PT Start Time 1907    PT Stop Time 1945    PT Time Calculation (min) 38 min    Equipment Utilized During Treatment --    Activity Tolerance Patient tolerated treatment well    Behavior During Therapy WFL for tasks assessed/performed                Past Medical History:  Diagnosis Date   Allergy    Chicken pox    Depression    Diverticulitis    07/11/20   History of alcohol abuse    Hypertension    Low testosterone    Past Surgical History:  Procedure Laterality Date   ANKLE SURGERY Left    BB removal   COLONOSCOPY WITH PROPOFOL N/A 01/18/2020   Procedure: COLONOSCOPY WITH PROPOFOL;  Surgeon: Virgel Manifold, MD;  Location: [facility] ENDOSCOPY;  Service: Endoscopy;  Laterality: N/A;   Patient Active Problem List   Diagnosis Date Noted   Left hip pain 01/13/2022   Weight loss 03/06/2021   Diverticulitis 07/14/2020   Encounter for screening colonoscopy    Polyp of sigmoid colon    Plantar fasciitis 07/03/2018   Acute non-recurrent maxillary sinusitis 04/26/2018   Routine physical examination 09/15/2016   Essential hypertension 03/12/2015   Depression 03/12/2015   Low testosterone 03/12/2015   Obesity (BMI 30.0-34.9) 03/12/2015    REFERRING DIAG: left hip pain   THERAPY DIAG:  Pain in left hip  Muscle weakness (generalized)  Difficulty in walking, not  elsewhere classified  Rationale for Evaluation and Treatment: Rehabilitation  PERTINENT HISTORY: L hip pain. Pain began about 5 weeks prior to initial PT eval. Had similar pain months back. Pain started at the top and at the side of his L hip joint. Saw his PCP for a routine checkup and told her about hip pain. 5 months ago, pt was bowling and might have stepped the wrong way affecting his L knee. Was given meloxicam which helped. Feels like his L LE is restless, especially at night. Pt feels L lateral hip and thigh pain running down to his L knee (along IT band and L5 patheway). Just finished his prednisone which helped. 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   PRECAUTIONS: none  SUBJECTIVE:   SUBJECTIVE STATEMENT: Patinet reports he is feeling well and his hip pain is between 0-1/10. He played disc golf yesterday which involved walking in a lot of mud. His pain may have kicked up to about 1.5/10 after the mud walking. He has been sleeping fine. He had one day last week that made it feel like it was trying to start hurting but it didn't. He felt like maybe this was with twisting. He has done his HEP one time since last PT session.  PAIN:  Are you having pain? NPRS 0.5/10 left proximal glute med region.   OBJECTIVE  SELF-REPORTED FUNCTION FOTO score: 75/100 (hip questionnaire)   TODAY'S TREATMENT:      Therapeutic exercise: to centralize symptoms and improve ROM, strength, muscular endurance, and activity tolerance required for successful completion of functional activities.  - Treadmill 2 mph at 0% grade with intermittent UE support. For improved lower extremity mobility, muscular endurance, and weightbearing activity tolerance; and to induce the analgesic effect of aerobic exercise, stimulate improved joint nutrition, and prepare body structures and systems for following interventions. x 5 minutes. Required assistance to set up machine. - hip hikes/drops  with one foot on step, 3x10 each side with UE support. - standing pallof press with SLS/modified SLS, 2x10 each foot each direction with BlackTB - runner's step up to 12 inch step with intermittent UE support, 2x10 each side.  - standing lateral step keeping feet at least hip width apart, 1x21 feet each direction with BlackTB around ankles.  - standing forwards/backwards monster walks, 2x21 feet each direction with BlackTB around ankles.   Pt required multimodal cuing for proper technique and to facilitate improved neuromuscular control, strength, range of motion, and functional ability resulting in improved performance and form.  PATIENT EDUCATION: Education details: Exercise purpose/form. Self management techniques. Education on HEP including handout  Reviewed cancelation/no-show policy with patient and confirmed patient has correct phone number for clinic; patient verbalized understanding (03/02/22). Person educated: Patient Education method: Explanation, Corporate treasurer cues, Verbal cues, and Handouts Education comprehension: verbalized understanding, returned demonstration, verbal cues required, and needs further education  HOME EXERCISE PROGRAM: Access Code: [email or web address] URL: [email or web address] Date: 03/09/2022 Prepared by: Rosita Kea  Exercises - Hip Hikes off step  - 1 x daily - 3 sets - 10 reps  HOME EXERCISE PROGRAM [[email or web address]] view at "[email or web address]" using code: [email or web address]  Hip Abduction Isometric -  Repeat 10 Times, Hold 5 Seconds, Perform 3 Times a Day  BRIDGING ELASTIC BAND ABDUCTION -  Repeat 10 Times, Hold 5 Seconds, Complete 2 Sets, Perform 1 Times a Day   PT Short Term Goals       PT SHORT TERM GOAL #1   Title Pt will be independent with his initial HEP to decrease pain, improve strength, function, ability to perform work tasks more comfortably.    Baseline Pt has started his initial HEP (02/24/2022)    Time 3    Period Weeks    Status MET 03/02/2022    Target Date 03/18/22              PT Long Term Goals TARGET DATE FOR ALL LONG TERM GOALS: 04/22/2022      PT LONG TERM GOAL #1   Title Pt will have a decrease in L hip pain to 3/10 or less at worst to promote ability to sit and drive for work, ambulate longer distances, and sleep on his L side more comfortably.    Baseline 9/10 L hip pain at worst for the past 3 months (02/24/2022)    Time 8    Period Weeks    Status In-Progress   Target Date 04/22/22      PT LONG TERM GOAL #2   Title Pt will improve his hip FOTO score by at least 10 points as a demonstration of improved function.    Baseline L hip FOTO 57 (02/24/2022)    Time 8    Period Weeks    Status In-progress   Target Date  PT LONG TERM GOAL #3   Title Pt will improve bilateral hip extension and abduction strength by at least 1/2 MMT grade to promote ability to perform standing tasks as well as ambulate longer distances for work more comfortably.    Baseline Hip extension 3+/5 R, 3+/5 L, hip abduction 4-/5 R, 4-/5 L (with symptoms) (02/24/2022).    Time 8    Period Weeks    Status In-Progress   Target Date              Plan -    Clinical Impression Statement Patient arrives with rcontinued report of improving L hip pain and activity tolerance. Continued with progressive loading to the left glute med and hip muscles to improve functional activity tolerance through decreasing pain and improving load tolerance. Patient tolerated progressions well within session and has met his FOTO goal. He continues to report some pain and quick fatigue at L lateral hip. Plan to continue with progressive loading as appropriate. Patient would benefit from continued management of limiting condition by skilled physical therapist to address remaining impairments and functional limitations to work towards stated goals and return to PLOF or maximal functional independence.     Personal Factors and Comorbidities Comorbidity 2;Profession     Comorbidities Depression, HTN    Examination-Activity Limitations Sit;Sleep;Locomotion Level    Stability/Clinical Decision Making Stable/Uncomplicated    Rehab Potential Fair    PT Frequency 2x / week    PT Duration 8 weeks    PT Treatment/Interventions Therapeutic exercise;Neuromuscular re-education;Therapeutic activities;Manual techniques;Patient/family education;Dry needling;Aquatic Therapy;Electrical Stimulation;Iontophoresis '4mg'$ /ml Dexamethasone    PT Next Visit Plan Glute med and min isometrics, then concentric, then eccentric muscle actvation. Manual techniques, modalities PRN    Consulted and Agree with Plan of Care Patient              Nancy Nordmann, PT, DPT 03/29/2022, 8:19 PM  [facility] [address] [place], [place] [address] P: [phone] I F: [phone]

## 2022-03-31 ENCOUNTER — Ambulatory Visit: Payer: Federal, State, Local not specified - PPO

## 2022-03-31 DIAGNOSIS — R262 Difficulty in walking, not elsewhere classified: Secondary | ICD-10-CM

## 2022-03-31 DIAGNOSIS — M6281 Muscle weakness (generalized): Secondary | ICD-10-CM

## 2022-03-31 DIAGNOSIS — M25552 Pain in left hip: Secondary | ICD-10-CM

## 2022-03-31 NOTE — Therapy (Signed)
OUTPATIENT PHYSICAL THERAPY TREATMENT NOTE   Patient Name: Bernard Blake MRN: [id number] DOB:[birth year], 52 y.o., male Today's Date: 03/31/2022  PCP: [name], FNP  REFERRING PROVIDER: [name], FNP  END OF SESSION:  PT End of Session - 03/31/22 1912     Visit Number 6    Number of Visits 17    Date for PT Re-Evaluation 04/22/22    Authorization Type [employer] reporting period from 03/02/2022    Authorization Time Period VL: 82 PT/OT/ST - 24 remain    Authorization - Visit Number 5    Authorization - Number of Visits 46    Progress Note Due on Visit 10    PT Start Time 1904    PT Stop Time 1944    PT Time Calculation (min) 40 min    Activity Tolerance Patient tolerated treatment well;No increased pain    Behavior During Therapy [facility] for tasks assessed/performed                Past Medical History:  Diagnosis Date   Allergy    Chicken pox    Depression    Diverticulitis    07/11/20   History of alcohol abuse    Hypertension    Low testosterone    Past Surgical History:  Procedure Laterality Date   ANKLE SURGERY Left    BB removal   COLONOSCOPY WITH PROPOFOL N/A 01/18/2020   Procedure: COLONOSCOPY WITH PROPOFOL;  Surgeon: Virgel Manifold, MD;  Location: [facility] ENDOSCOPY;  Service: Endoscopy;  Laterality: N/A;   Patient Active Problem List   Diagnosis Date Noted   Left hip pain 01/13/2022   Weight loss 03/06/2021   Diverticulitis 07/14/2020   Encounter for screening colonoscopy    Polyp of sigmoid colon    Plantar fasciitis 07/03/2018   Acute non-recurrent maxillary sinusitis 04/26/2018   Routine physical examination 09/15/2016   Essential hypertension 03/12/2015   Depression 03/12/2015   Low testosterone 03/12/2015   Obesity (BMI 30.0-34.9) 03/12/2015    REFERRING DIAG: left hip pain   THERAPY DIAG:  Pain in left hip  Muscle weakness (generalized)  Difficulty in walking, not elsewhere  classified  Rationale for Evaluation and Treatment: Rehabilitation  PERTINENT HISTORY: L hip pain. Pain began about 5 weeks prior to initial PT eval. Had similar pain months back. Pain started at the top and at the side of his L hip joint. Saw his PCP for a routine checkup and told her about hip pain. 5 months ago, pt was bowling and might have stepped the wrong way affecting his L knee. Was given meloxicam which helped. Feels like his L LE is restless, especially at night. Pt feels L lateral hip and thigh pain running down to his L knee (along IT band and L5 patheway). Just finished his prednisone which helped. 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   PRECAUTIONS: none  SUBJECTIVE:   SUBJECTIVE STATEMENT: Patinet reports he is feeling well and his hip pain is between 0-1/10. He played disc golf yesterday which involved walking in a lot of mud. His pain may have kicked up to about 1.5/10 after the mud walking. He has been sleeping fine. He had one day last week that made it feel like it was trying to start hurting but it didn't. He felt like maybe this was with twisting. He has done his HEP one time since last PT session.   PAIN:  Are you having  pain? NPRS 0.5/10 left proximal glute med region.   OBJECTIVE  SELF-REPORTED FUNCTION FOTO score: 75/100 (hip questionnaire)   TODAY'S TREATMENT:      Therapeutic exercise: to centralize symptoms and improve ROM, strength, muscular endurance, and activity tolerance required for successful completion of functional activities.  - Treadmill 2 mph at 0% grade with intermittent UE support. For improved lower extremity mobility, muscular endurance, and weightbearing activity tolerance; and to induce the analgesic effect of aerobic exercise, stimulate improved joint nutrition, and prepare body structures and systems for following interventions. x 5 minutes. Required assistance to set up machine.  - hip hikes/drops with one  foot on 4" step, 1x15 each side with UE support. - short sitting RedTB loop hip IR 1x12x1secH - hip hikes/drops with one foot on 4" step, 1x15 each side with UE support. - short sitting GreenTB loop hip IR 1x12x1secH - hip hikes/drops with one foot on 2" step, x12 each side with UE support (20lb KB)  - farmers carry 400lb c 15lb FW bilat  - standing lateral step keeping feet at least hip width apart, 2x21 feet each direction with BlackTB at ankles  -standing hip hike c 40lb KB axial load 1x8 bilat   Pt required multimodal cuing for proper technique and to facilitate improved neuromuscular control, strength, range of motion, and functional ability resulting in improved performance and form.  PATIENT EDUCATION: Education details: Exercise purpose/form. Self management techniques. Education on HEP including handout  Reviewed cancelation/no-show policy with patient and confirmed patient has correct phone number for clinic; patient verbalized understanding (03/02/22). Person educated: Patient Education method: Explanation, Corporate treasurer cues, Verbal cues, and Handouts Education comprehension: verbalized understanding, returned demonstration, verbal cues required, and needs further education  HOME EXERCISE PROGRAM: Access Code: [email or web address] URL: [email or web address] Date: 03/09/2022 Prepared by: Rosita Kea  Exercises - Hip Hikes off step  - 1 x daily - 3 sets - 10 reps  HOME EXERCISE PROGRAM [[email or web address]] view at "[email or web address]" using code: [email or web address]  Hip Abduction Isometric -  Repeat 10 Times, Hold 5 Seconds, Perform 3 Times a Day  BRIDGING ELASTIC BAND ABDUCTION -  Repeat 10 Times, Hold 5 Seconds, Complete 2 Sets, Perform 1 Times a Day   PT Short Term Goals       PT SHORT TERM GOAL #1   Title Pt will be independent with his initial HEP to decrease pain, improve strength, function, ability to perform work tasks more comfortably.    Baseline Pt has started his initial HEP  (02/24/2022)    Time 3    Period Weeks    Status MET 03/02/2022   Target Date 03/18/22              PT Long Term Goals TARGET DATE FOR ALL LONG TERM GOALS: 04/22/2022      PT LONG TERM GOAL #1   Title Pt will have a decrease in L hip pain to 3/10 or less at worst to promote ability to sit and drive for work, ambulate longer distances, and sleep on his L side more comfortably.    Baseline 9/10 L hip pain at worst for the past 3 months (02/24/2022)    Time 8    Period Weeks    Status In-Progress   Target Date 04/22/22      PT LONG TERM GOAL #2   Title Pt will improve his hip FOTO score by at least 10 points as a demonstration of improved function.    Baseline L hip FOTO 57 (02/24/2022)  Time 8    Period Weeks    Status In-progress   Target Date      PT LONG TERM GOAL #3   Title Pt will improve bilateral hip extension and abduction strength by at least 1/2 MMT grade to promote ability to perform standing tasks as well as ambulate longer distances for work more comfortably.    Baseline Hip extension 3+/5 R, 3+/5 L, hip abduction 4-/5 R, 4-/5 L (with symptoms) (02/24/2022).    Time 8    Period Weeks    Status In-Progress   Target Date              Plan -    Clinical Impression Statement Plan to continue with progressive loading as appropriate. Added axial KB load to hip hikes with success, included transition to balance component. Patient would benefit from continued management of limiting condition by skilled physical therapist to address remaining impairments and functional limitations to work towards stated goals and return to PLOF or maximal functional independence.    Personal Factors and Comorbidities Comorbidity 2;Profession    Comorbidities Depression, HTN    Examination-Activity Limitations Sit;Sleep;Locomotion Level    Stability/Clinical Decision Making Stable/Uncomplicated    Rehab Potential Fair    PT Frequency 2x / week    PT Duration 8 weeks    PT  Treatment/Interventions Therapeutic exercise;Neuromuscular re-education;Therapeutic activities;Manual techniques;Patient/family education;Dry needling;Aquatic Therapy;Electrical Stimulation;Iontophoresis '4mg'$ /ml Dexamethasone    PT Next Visit Plan Glute med and min isometrics, then concentric, then eccentric muscle actvation. Manual techniques, modalities PRN    Consulted and Agree with Plan of Care Patient            7:15 PM, 03/31/22 Etta Grandchild, PT, DPT Physical Therapist - [place] [phone] (Office)   Etta Grandchild, PT, DPT 03/31/2022, 7:15 PM  [place] [address] [place], [place] [address] P: [phone] I F: [phone]

## 2022-04-05 ENCOUNTER — Ambulatory Visit: Payer: Federal, State, Local not specified - PPO | Admitting: Physical Therapy

## 2022-04-07 ENCOUNTER — Encounter: Payer: Self-pay | Admitting: Physical Therapy

## 2022-04-07 ENCOUNTER — Ambulatory Visit: Payer: Federal, State, Local not specified - PPO | Admitting: Physical Therapy

## 2022-04-07 DIAGNOSIS — R262 Difficulty in walking, not elsewhere classified: Secondary | ICD-10-CM | POA: Diagnosis not present

## 2022-04-07 DIAGNOSIS — M6281 Muscle weakness (generalized): Secondary | ICD-10-CM | POA: Diagnosis not present

## 2022-04-07 DIAGNOSIS — M25552 Pain in left hip: Secondary | ICD-10-CM

## 2022-04-07 NOTE — Therapy (Signed)
OUTPATIENT PHYSICAL THERAPY TREATMENT NOTE   Patient Name: Bernard Blake MRN: [id number] DOB:1970-11-19, 52 y.o., male Today's Date: 04/07/2022  PCP: [name], FNP  REFERRING PROVIDER: [name], FNP  END OF SESSION:  PT End of Session - 04/07/22 1958     Visit Number 7    Number of Visits 17    Date for PT Re-Evaluation 04/22/22    Authorization Type [employer] reporting period from 03/02/2022    Authorization Time Period VL: 71 PT/OT/ST - 61 remain    Authorization - Visit Number 7    Authorization - Number of Visits 45    Progress Note Due on Visit 10    PT Start Time 1905    PT Stop Time 1945    PT Time Calculation (min) 40 min    Activity Tolerance Patient tolerated treatment well;No increased pain    Behavior During Therapy [facility] for tasks assessed/performed                 Past Medical History:  Diagnosis Date   Allergy    Chicken pox    Depression    Diverticulitis    07/11/20   History of alcohol abuse    Hypertension    Low testosterone    Past Surgical History:  Procedure Laterality Date   ANKLE SURGERY Left    BB removal   COLONOSCOPY WITH PROPOFOL N/A 01/18/2020   Procedure: COLONOSCOPY WITH PROPOFOL;  Surgeon: Virgel Manifold, MD;  Location: [facility] ENDOSCOPY;  Service: Endoscopy;  Laterality: N/A;   Patient Active Problem List   Diagnosis Date Noted   Left hip pain 01/13/2022   Weight loss 03/06/2021   Diverticulitis 07/14/2020   Encounter for screening colonoscopy    Polyp of sigmoid colon    Plantar fasciitis 07/03/2018   Acute non-recurrent maxillary sinusitis 04/26/2018   Routine physical examination 09/15/2016   Essential hypertension 03/12/2015   Depression 03/12/2015   Low testosterone 03/12/2015   Obesity (BMI 30.0-34.9) 03/12/2015    REFERRING DIAG: left hip pain   THERAPY DIAG:  Pain in left hip  Muscle weakness (generalized)  Difficulty in walking, not elsewhere  classified  Rationale for Evaluation and Treatment: Rehabilitation  PERTINENT HISTORY: L hip pain. Pain began about 5 weeks prior to initial PT eval. Had similar pain months back. Pain started at the top and at the side of his L hip joint. Saw his PCP for a routine checkup and told her about hip pain. 5 months ago, pt was bowling and might have stepped the wrong way affecting his L knee. Was given meloxicam which helped. Feels like his L LE is restless, especially at night. Pt feels L lateral hip and thigh pain running down to his L knee (along IT band and L5 patheway). Just finished his prednisone which helped. 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   PRECAUTIONS: none  SUBJECTIVE:   SUBJECTIVE STATEMENT: Patient reports he had a diverticulitis flair. He states he had a couple of days of sitting or laying down and napping. He felt his left hip being achy. He did some disc golf hiking through the woods over the weekend. He only had achiness and not sharp pains or shooting pain down to the knee. It was about 2.5/10 on the NPRS. Night before last his left hip was hurting when trying to sleep on that side with pillow between legs. It kept him up tossing and turning  for about 1.5 hours until it calmed itself down or he got in a position where it didn't bother him. He usually doesn't have trouble sleeping. He did have chills but he didn't take his temperature. He did not work today but plans to go back tomorrow.   PAIN:  Are you having pain? NPRS 0/10 left proximal glute med region.   OBJECTIVE  TODAY'S TREATMENT:      Therapeutic exercise: to centralize symptoms and improve ROM, strength, muscular endurance, and activity tolerance required for successful completion of functional activities.  - Treadmill 2 mph at 0% grade with intermittent UE support. For improved lower extremity mobility, muscular endurance, and weightbearing activity tolerance; and to induce the  analgesic effect of aerobic exercise, stimulate improved joint nutrition, and prepare body structures and systems for following interventions. x 5 minutes. Required assistance to set up machine.  Superset:  -standing hip hike c 40lb KB axial load 2x10 each side with foot on 2 inch step (2 weight plates stacked).  - short sitting GTB/BlackTB loop B hip IR with small green ball squeezed between knees, 2x15x1secH  - runner's step up to 12 inch step with contralateral suitcase carry, 3x10 each side with 15/30/40# - short sitting BlackTB loop B hip IR with small green ball squeezed between knees, 1x15x1secH  Pt required multimodal cuing for proper technique and to facilitate improved neuromuscular control, strength, range of motion, and functional ability resulting in improved performance and form.  PATIENT EDUCATION: Education details: Exercise purpose/form. Self management techniques. Education on HEP including handout  Reviewed cancelation/no-show policy with patient and confirmed patient has correct phone number for clinic; patient verbalized understanding (03/02/22). Person educated: Patient Education method: Explanation, Corporate treasurer cues, Verbal cues, and Handouts Education comprehension: verbalized understanding, returned demonstration, verbal cues required, and needs further education  HOME EXERCISE PROGRAM: Access Code: [email or web address] URL: [email or web address] Date: 03/09/2022 Prepared by: Rosita Kea  Exercises - Hip Hikes off step  - 1 x daily - 3 sets - 10 reps  HOME EXERCISE PROGRAM [[email or web address]] view at "[email or web address]" using code: [email or web address]  Hip Abduction Isometric -  Repeat 10 Times, Hold 5 Seconds, Perform 3 Times a Day  BRIDGING ELASTIC BAND ABDUCTION -  Repeat 10 Times, Hold 5 Seconds, Complete 2 Sets, Perform 1 Times a Day   PT Short Term Goals       PT SHORT TERM GOAL #1   Title Pt will be independent with his initial HEP to decrease pain, improve strength,  function, ability to perform work tasks more comfortably.    Baseline Pt has started his initial HEP (02/24/2022)    Time 3    Period Weeks    Status MET 03/02/2022   Target Date 03/18/22              PT Long Term Goals TARGET DATE FOR ALL LONG TERM GOALS: 04/22/2022      PT LONG TERM GOAL #1   Title Pt will have a decrease in L hip pain to 3/10 or less at worst to promote ability to sit and drive for work, ambulate longer distances, and sleep on his L side more comfortably.    Baseline 9/10 L hip pain at worst for the past 3 months (02/24/2022)    Time 8    Period Weeks    Status In-Progress   Target Date 04/22/22      PT LONG TERM GOAL #2   Title Pt will improve his hip FOTO score by at least 10 points  as a demonstration of improved function.    Baseline L hip FOTO 57 (02/24/2022)    Time 8    Period Weeks    Status In-progress   Target Date      PT LONG TERM GOAL #3   Title Pt will improve bilateral hip extension and abduction strength by at least 1/2 MMT grade to promote ability to perform standing tasks as well as ambulate longer distances for work more comfortably.    Baseline Hip extension 3+/5 R, 3+/5 L, hip abduction 4-/5 R, 4-/5 L (with symptoms) (02/24/2022).    Time 8    Period Weeks    Status In-Progress   Target Date              Plan -    Clinical Impression Statement Patient arrives with continued report of intermittent pain that disrupts his daily life but good tolerance to last PT session. Continued with progressive loading to the hips including more isolated and functional exercises. Patient tolerated intervention well with no complaint of increased pain by end of session and appeared appropriately challenged. Patient would benefit from continued management of limiting condition by skilled physical therapist to address remaining impairments and functional limitations to work towards stated goals and return to PLOF or maximal functional independence.  .     Personal Factors and Comorbidities Comorbidity 2;Profession    Comorbidities Depression, HTN    Examination-Activity Limitations Sit;Sleep;Locomotion Level    Stability/Clinical Decision Making Stable/Uncomplicated    Rehab Potential Fair    PT Frequency 2x / week    PT Duration 8 weeks    PT Treatment/Interventions Therapeutic exercise;Neuromuscular re-education;Therapeutic activities;Manual techniques;Patient/family education;Dry needling;Aquatic Therapy;Electrical Stimulation;Iontophoresis '4mg'$ /ml Dexamethasone    PT Next Visit Plan Glute med and min isometrics, then concentric, then eccentric muscle actvation. Manual techniques, modalities PRN    Consulted and Agree with Plan of Care Patient              Nancy Nordmann, PT, DPT 04/07/2022, 8:01 PM  [facility] [address] [place], [place] [address] P: [phone] I F: [phone]

## 2022-04-08 ENCOUNTER — Encounter: Payer: Federal, State, Local not specified - PPO | Admitting: Physical Therapy

## 2022-04-12 ENCOUNTER — Ambulatory Visit: Payer: Federal, State, Local not specified - PPO | Admitting: Physical Therapy

## 2022-04-12 DIAGNOSIS — M25552 Pain in left hip: Secondary | ICD-10-CM

## 2022-04-12 DIAGNOSIS — M6281 Muscle weakness (generalized): Secondary | ICD-10-CM | POA: Diagnosis not present

## 2022-04-12 DIAGNOSIS — R262 Difficulty in walking, not elsewhere classified: Secondary | ICD-10-CM | POA: Diagnosis not present

## 2022-04-12 NOTE — Therapy (Signed)
OUTPATIENT PHYSICAL THERAPY TREATMENT NOTE   Patient Name: Bernard Blake MRN: [id number] DOB:02/25/1971, 52 y.o., male Today's Date: 04/12/2022  PCP: [name], FNP  REFERRING PROVIDER: [name], FNP  END OF SESSION:  PT End of Session - 04/12/22 1820     Visit Number 8    Number of Visits 17    Date for PT Re-Evaluation 04/22/22    Authorization Type BL[employer] reporting period from 03/02/2022    Authorization Time Period VL: 54 PT/OT/ST - 68 remain    Authorization - Visit Number 8    Authorization - Number of Visits 45    Progress Note Due on Visit 10    PT Start Time 1820    PT Stop Time 1900    PT Time Calculation (min) 40 min    Activity Tolerance Patient tolerated treatment well;No increased pain    Behavior During Therapy [facility] for tasks assessed/performed              Past Medical History:  Diagnosis Date   Allergy    Chicken pox    Depression    Diverticulitis    07/11/20   History of alcohol abuse    Hypertension    Low testosterone    Past Surgical History:  Procedure Laterality Date   ANKLE SURGERY Left    BB removal   COLONOSCOPY WITH PROPOFOL N/A 01/18/2020   Procedure: COLONOSCOPY WITH PROPOFOL;  Surgeon: Virgel Manifold, MD;  Location: [facility] ENDOSCOPY;  Service: Endoscopy;  Laterality: N/A;   Patient Active Problem List   Diagnosis Date Noted   Left hip pain 01/13/2022   Weight loss 03/06/2021   Diverticulitis 07/14/2020   Encounter for screening colonoscopy    Polyp of sigmoid colon    Plantar fasciitis 07/03/2018   Acute non-recurrent maxillary sinusitis 04/26/2018   Routine physical examination 09/15/2016   Essential hypertension 03/12/2015   Depression 03/12/2015   Low testosterone 03/12/2015   Obesity (BMI 30.0-34.9) 03/12/2015    REFERRING DIAG: left hip pain   THERAPY DIAG:  Pain in left hip  Muscle weakness (generalized)  Difficulty in walking, not elsewhere classified  Rationale  for Evaluation and Treatment: Rehabilitation  PERTINENT HISTORY: L hip pain. Pain began about 5 weeks prior to initial PT eval. Had similar pain months back. Pain started at the top and at the side of his L hip joint. Saw his PCP for a routine checkup and told her about hip pain. 5 months ago, pt was bowling and might have stepped the wrong way affecting his L knee. Was given meloxicam which helped. Feels like his L LE is restless, especially at night. Pt feels L lateral hip and thigh pain running down to his L knee (along IT band and L5 patheway). Just finished his prednisone which helped. 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   PRECAUTIONS: none  SUBJECTIVE:   SUBJECTIVE STATEMENT: Patient reports he is feeling well and has no to minimal pain upon arrival. He states he had some flashes of pain over the distal L quad. He states he is not sure his pain isn't from his back. He states he has a history of back injuries when lifting and twisting at work which causes his muscles to tense up so he has to go to the chiropractor and take some muscle relaxers. He would usually miss work for a couple of days and then be able to return. He denies  ever having pain down his legs. The pain has usually been on the right side. He does have flairs of sciatica, last time on left side down to his toes.   PAIN:  Are you having pain? NPRS 0-0.5/10 left proximal glute med region.   OBJECTIVE  TODAY'S TREATMENT:      Therapeutic exercise: to centralize symptoms and improve ROM, strength, muscular endurance, and activity tolerance required for successful completion of functional activities.  - Treadmill 3 mph at 2% grade with intermittent UE support. For improved lower extremity mobility, muscular endurance, and weightbearing activity tolerance; and to induce the analgesic effect of aerobic exercise, stimulate improved joint nutrition, and prepare body structures and systems for following  interventions. x 5 minutes. Required assistance to set up machine. - lumbar flexion and extension AROM - no reproduction of concordant left hip pain.   (Manual/dry needling - see below)  -standing hip hike c 40lb KB axial load 2x10 each side with foot on 2 inch step (2 weight plates stacked).   Manual therapy: to reduce pain and tissue tension, improve range of motion, neuromodulation, in order to promote improved ability to complete functional activities. PRONE - CPA along thoracic and lumbar segments, most tender at lowest lumbar region and hypomobile throughout but not concordant pain.  - STM to bilateral glute max, glute min/med, deep hip rotators, and greater trochanter bilaterally. Concordant pain only at L proximal glute med, so spent more time with sustained pressure that resulted in palpable softening of muscle in that region.   Modality:  Dry needling performed to left proximal glute med to decrease pain and spasms along patient's left hip and knee region with patient in right sidelying utilizing 1 dry needle(s) .65m x 376mwith 2 sticks at proximal glute med muscle. Patient educated about the risks and benefits from therapy and verbally consents to treatment.  Dry needling performed by SaEverlean AlstromSnGraylon GoodT, DPT who is certified in this technique.   Pt required multimodal cuing for proper technique and to facilitate improved neuromuscular control, strength, range of motion, and functional ability resulting in improved performance and form.  PATIENT EDUCATION: Education details: Exercise purpose/form. Self management techniques. Education on HEP including handout  Reviewed cancelation/no-show policy with patient and confirmed patient has correct phone number for clinic; patient verbalized understanding (03/02/22). Person educated: Patient Education method: Explanation, [employer], Verbal cues, and Handouts Education comprehension: verbalized understanding, returned demonstration,  verbal cues required, and needs further education  HOME EXERCISE PROGRAM: Access Code: [email or web address]: [email or web address] Date: 03/09/2022 Prepared by: SaRosita KeaExercises - Hip Hikes off step  - 1 x daily - 3 sets - 10 reps  HOME EXERCISE PROGRAM [[email or web address]] view at "[email or web address]" using code: [id number] Abduction Isometric -  Repeat 10 Times, Hold 5 Seconds, Perform 3 Times a Day  BRIDGING ELASTIC BAND ABDUCTION -  Repeat 10 Times, Hold 5 Seconds, Complete 2 Sets, Perform 1 Times a Day   PT Short Term Goals       PT SHORT TERM GOAL #1   Title Pt will be independent with his initial HEP to decrease pain, improve strength, function, ability to perform work tasks more comfortably.    Baseline Pt has started his initial HEP (02/24/2022)    Time 3    Period Weeks    Status MET 03/02/2022   Target Date 03/18/22              PT Long Term Goals TARGET DATE FOR ALL  LONG TERM GOALS: 04/22/2022      PT LONG TERM GOAL #1   Title Pt will have a decrease in L hip pain to 3/10 or less at worst to promote ability to sit and drive for work, ambulate longer distances, and sleep on his L side more comfortably.    Baseline 9/10 L hip pain at worst for the past 3 months (02/24/2022)    Time 8    Period Weeks    Status In-Progress   Target Date 04/22/22      PT LONG TERM GOAL #2   Title Pt will improve his hip FOTO score by at least 10 points as a demonstration of improved function.    Baseline L hip FOTO 57 (02/24/2022)    Time 8    Period Weeks    Status In-progress   Target Date      PT LONG TERM GOAL #3   Title Pt will improve bilateral hip extension and abduction strength by at least 1/2 MMT grade to promote ability to perform standing tasks as well as ambulate longer distances for work more comfortably.    Baseline Hip extension 3+/5 R, 3+/5 L, hip abduction 4-/5 R, 4-/5 L (with symptoms) (02/24/2022).    Time 8    Period Weeks    Status In-Progress    Target Date              Plan -    Clinical Impression Statement Patient arrives with continued improved pain since starting PT but with continued intermittent lingering discomfort and with concerns it could be from his back. Session included some re-exam of back including AROM flexion and extension that did not contribute to concordant pain. CPA along thoracic and lumbar spine did not contribute to pain. Patient's concordant pain was only reproduced with palpation to proximal L glute med muscle. Introduced dry needling followed by STM to the proximal left glute med muscle and loading of the muscle tendon unit. Patient tolerated intervention well and had at least one muscle twitch with needling. Patient's proximal glute med tender and tight to touch with relaxation noted with sustained pressure. Plan to follow up with longer term response to dry needling and manual by next session to guide interventions that day. Patient would benefit from continued management of limiting condition by skilled physical therapist to address remaining impairments and functional limitations to work towards stated goals and return to PLOF or maximal functional independence. .    Personal Factors and Comorbidities Comorbidity 2;Profession    Comorbidities Depression, HTN    Examination-Activity Limitations Sit;Sleep;Locomotion Level    Stability/Clinical Decision Making Stable/Uncomplicated    Rehab Potential Fair    PT Frequency 2x / week    PT Duration 8 weeks    PT Treatment/Interventions Therapeutic exercise;Neuromuscular re-education;Therapeutic activities;Manual techniques;Patient/family education;Dry needling;Aquatic Therapy;Electrical Stimulation;Iontophoresis '4mg'$ /ml Dexamethasone    PT Next Visit Plan Glute med and min isometrics, then concentric, then eccentric muscle actvation. Manual techniques, modalities PRN    Consulted and Agree with Plan of Care Patient              Nancy Nordmann, PT,  DPT 04/12/2022, 7:19 PM  [facility] [address] [place], [place] [address] P: [phone] I F: [phone]

## 2022-04-14 ENCOUNTER — Ambulatory Visit: Payer: Federal, State, Local not specified - PPO | Admitting: Physical Therapy

## 2022-04-14 ENCOUNTER — Encounter: Payer: Self-pay | Admitting: Physical Therapy

## 2022-04-14 DIAGNOSIS — M6281 Muscle weakness (generalized): Secondary | ICD-10-CM | POA: Diagnosis not present

## 2022-04-14 DIAGNOSIS — R262 Difficulty in walking, not elsewhere classified: Secondary | ICD-10-CM

## 2022-04-14 DIAGNOSIS — M25552 Pain in left hip: Secondary | ICD-10-CM | POA: Diagnosis not present

## 2022-04-14 NOTE — Therapy (Addendum)
OUTPATIENT PHYSICAL THERAPY TREATMENT NOTE    Patient Name: Bernard Blake MRN: [id number] DOB:1970/04/29, 52 y.o., male Today's Date: 04/14/2022  PCP: [name], FNP  REFERRING PROVIDER: [name], FNP  END OF SESSION:  PT End of Session - 04/14/22 1821     Visit Number 9   Number of Visits 17    Date for PT Re-Evaluation 04/22/22    Authorization Type [employer] reporting period from 03/02/2022    Authorization Time Period VL: 75 PT/OT/ST - 53 remain    Authorization - Visit Number 10    Authorization - Number of Visits 45    Progress Note Due on Visit 10    PT Start Time 1820    PT Stop Time 1900    PT Time Calculation (min) 40 min    Activity Tolerance Patient tolerated treatment well;No increased pain    Behavior During Therapy [facility] for tasks assessed/performed              Past Medical History:  Diagnosis Date   Allergy    Chicken pox    Depression    Diverticulitis    07/11/20   History of alcohol abuse    Hypertension    Low testosterone    Past Surgical History:  Procedure Laterality Date   ANKLE SURGERY Left    BB removal   COLONOSCOPY WITH PROPOFOL N/A 01/18/2020   Procedure: COLONOSCOPY WITH PROPOFOL;  Surgeon: Virgel Manifold, MD;  Location: [facility] ENDOSCOPY;  Service: Endoscopy;  Laterality: N/A;   Patient Active Problem List   Diagnosis Date Noted   Left hip pain 01/13/2022   Weight loss 03/06/2021   Diverticulitis 07/14/2020   Encounter for screening colonoscopy    Polyp of sigmoid colon    Plantar fasciitis 07/03/2018   Acute non-recurrent maxillary sinusitis 04/26/2018   Routine physical examination 09/15/2016   Essential hypertension 03/12/2015   Depression 03/12/2015   Low testosterone 03/12/2015   Obesity (BMI 30.0-34.9) 03/12/2015    REFERRING DIAG: left hip pain   THERAPY DIAG:  Pain in left hip  Muscle weakness (generalized)  Difficulty in walking, not elsewhere classified  Rationale  for Evaluation and Treatment: Rehabilitation  PERTINENT HISTORY: L hip pain. Pain began about 5 weeks prior to initial PT eval. Had similar pain months back. Pain started at the top and at the side of his L hip joint. Saw his PCP for a routine checkup and told her about hip pain. 5 months ago, pt was bowling and might have stepped the wrong way affecting his L knee. Was given meloxicam which helped. Feels like his L LE is restless, especially at night. Pt feels L lateral hip and thigh pain running down to his L knee (along IT band and L5 patheway). Just finished his prednisone which helped. 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   PRECAUTIONS: none  SUBJECTIVE:   SUBJECTIVE STATEMENT: Patient reports he is feeling well. He was a little sore at his left hip after last PT session. He states the hip has not bothering him as much as before but his distal quad continues to bother him and he identifies it as a nerve like pain. It lasts like half a second and he describes it as dull. He did not do his HEP since last PT session.  PAIN:  Are you having pain? NPRS 0/10 left proximal glute med region.   OBJECTIVE  TODAY'S TREATMENT:  Therapeutic exercise: to centralize symptoms and improve ROM, strength, muscular endurance, and activity tolerance required for successful completion of functional activities.  - Treadmill 3 mph at 8% grade with B UE support. For improved lower extremity mobility, muscular endurance, and weightbearing activity tolerance; and to induce the analgesic effect of aerobic exercise, stimulate improved joint nutrition, and prepare body structures and systems for following interventions. x 6 minutes. Required assistance to set up machine.  (Manual/dry needling - see below)  -standing hip hike c 40lb KB axial load 1x10 each side with foot on 2 inch step (2 weight plates stacked).  - standing hip hike with contralateral 40lb KB load in farmers carry  2x10 each side with foot on 2 inch step (2 weight plates stacked). Use of PVC pipe for balance.  - single leg RDL with 20#KB in contralateral UE and ipsilateral UE support, 10 each side. Good form after cuing. Felt strong fatigue in L > R hip.   Manual therapy: to reduce pain and tissue tension, improve range of motion, neuromodulation, in order to promote improved ability to complete functional activities. SIDELYING - STM to left glute, glute min/med  Modality:  Dry needling performed to left proximal glute med to decrease pain and spasms along patient's left hip and knee region with patient in right sidelying utilizing 1 dry needle(s) .83m x 360mwith 2 sticks at proximal glute med muscle. Patient educated about the risks and benefits from therapy and verbally consents to treatment.  Dry needling performed by SaEverlean AlstromSnGraylon GoodT, DPT who is certified in this technique.  Pt required multimodal cuing for proper technique and to facilitate improved neuromuscular control, strength, range of motion, and functional ability resulting in improved performance and form.  PATIENT EDUCATION: Education details: Exercise purpose/form. Self management techniques. Education on HEP including handout  Reviewed cancelation/no-show policy with patient and confirmed patient has correct phone number for clinic; patient verbalized understanding (03/02/22). Person educated: Patient Education method: Explanation, [employer], Verbal cues, and Handouts Education comprehension: verbalized understanding, returned demonstration, verbal cues required, and needs further education  HOME EXERCISE PROGRAM: Access Code: [email or web address]: [email or web address] Date: 03/09/2022 Prepared by: SaRosita KeaExercises - Hip Hikes off step  - 1 x daily - 3 sets - 10 reps  HOME EXERCISE PROGRAM [[email or web address]] view at "[email or web address]" using code: [id number] Abduction Isometric -  Repeat 10 Times, Hold 5 Seconds,  Perform 3 Times a Day  BRIDGING ELASTIC BAND ABDUCTION -  Repeat 10 Times, Hold 5 Seconds, Complete 2 Sets, Perform 1 Times a Day   PT Short Term Goals       PT SHORT TERM GOAL #1   Title Pt will be independent with his initial HEP to decrease pain, improve strength, function, ability to perform work tasks more comfortably.    Baseline Pt has started his initial HEP (02/24/2022)    Time 3    Period Weeks    Status MET 03/02/2022   Target Date 03/18/22              PT Long Term Goals TARGET DATE FOR ALL LONG TERM GOALS: 04/22/2022      PT LONG TERM GOAL #1   Title Pt will have a decrease in L hip pain to 3/10 or less at worst to promote ability to sit and drive for work, ambulate longer distances, and sleep on his L side more comfortably.    Baseline 9/10 L hip pain at worst for the past 3  months (02/24/2022);    Time 8    Period Weeks    Status In-Progress   Target Date 04/22/22      PT LONG TERM GOAL #2   Title Pt will improve his hip FOTO score by at least 10 points as a demonstration of improved function.    Baseline L hip FOTO 57 (02/24/2022)    Time 8    Period Weeks    Status In-progress   Target Date      PT LONG TERM GOAL #3   Title Pt will improve bilateral hip extension and abduction strength by at least 1/2 MMT grade to promote ability to perform standing tasks as well as ambulate longer distances for work more comfortably.    Baseline Hip extension 3+/5 R, 3+/5 L, hip abduction 4-/5 R, 4-/5 L (with symptoms) (02/24/2022).    Time 8    Period Weeks    Status In-Progress   Target Date              Plan    Clinical Impression Statement Patient arrives with good response to dry needling last PT session but was still tender and tight to palpation at proximal glute med region. Repeated dry needling and manual therapy today followed by progressed strengthening and loading to the glute med/min muscle tendon unit. Plan for 10th visit progress note next visit.  Patient would benefit from continued management of limiting condition by skilled physical therapist to address remaining impairments and functional limitations to work towards stated goals and return to PLOF or maximal functional independence.    Personal Factors and Comorbidities Comorbidity 2;Profession    Comorbidities Depression, HTN    Examination-Activity Limitations Sit;Sleep;Locomotion Level    Stability/Clinical Decision Making Stable/Uncomplicated    Rehab Potential Fair    PT Frequency 2x / week    PT Duration 8 weeks    PT Treatment/Interventions Therapeutic exercise;Neuromuscular re-education;Therapeutic activities;Manual techniques;Patient/family education;Dry needling;Aquatic Therapy;Electrical Stimulation;Iontophoresis '4mg'$ /ml Dexamethasone    PT Next Visit Plan Glute med and min isometrics, then concentric, then eccentric muscle actvation. Manual techniques, modalities PRN    Consulted and Agree with Plan of Care Patient              Nancy Nordmann, PT, DPT 04/14/2022, 7:10 PM  [facility] [address] [place], [place] [address] P: [phone] I F: [phone]

## 2022-04-19 ENCOUNTER — Ambulatory Visit: Payer: Federal, State, Local not specified - PPO | Admitting: Physical Therapy

## 2022-04-19 ENCOUNTER — Encounter: Payer: Self-pay | Admitting: Physical Therapy

## 2022-04-19 DIAGNOSIS — M25552 Pain in left hip: Secondary | ICD-10-CM | POA: Diagnosis not present

## 2022-04-19 DIAGNOSIS — M6281 Muscle weakness (generalized): Secondary | ICD-10-CM | POA: Diagnosis not present

## 2022-04-19 DIAGNOSIS — R262 Difficulty in walking, not elsewhere classified: Secondary | ICD-10-CM

## 2022-04-19 NOTE — Therapy (Addendum)
OUTPATIENT PHYSICAL THERAPY TREATMENT NOTE / PROGRESS NOTE Dates of reporting from 02/24/2022 - 04/19/2022   Patient Name: Bernard Blake MRN: [id number] DOB:[birth year], 52 y.o., male Today's Date: 04/19/2022  PCP: [name], FNP  REFERRING PROVIDER: [name], FNP  END OF SESSION:  PT End of Session - 04/19/22 1955     Visit Number 10    Number of Visits 17    Date for PT Re-Evaluation 04/22/22    Authorization Type BLUE CROSS BLUE [employer]IELD reporting period from 03/02/2022    Authorization Time Period VL: 52 PT/OT/ST - 10 remain    Authorization - Visit Number 10    Authorization - Number of Visits 45    Progress Note Due on Visit 10    PT Start Time 1905    PT Stop Time 1945    PT Time Calculation (min) 40 min    Activity Tolerance Patient tolerated treatment well    Behavior During Therapy [facility] for tasks assessed/performed               Past Medical History:  Diagnosis Date   Allergy    Chicken pox    Depression    Diverticulitis    07/11/20   History of alcohol abuse    Hypertension    Low testosterone    Past Surgical History:  Procedure Laterality Date   ANKLE SURGERY Left    BB removal   COLONOSCOPY WITH PROPOFOL N/A 01/18/2020   Procedure: COLONOSCOPY WITH PROPOFOL;  Surgeon: Virgel Manifold, MD;  Location: [facility] ENDOSCOPY;  Service: Endoscopy;  Laterality: N/A;   Patient Active Problem List   Diagnosis Date Noted   Left hip pain 01/13/2022   Weight loss 03/06/2021   Diverticulitis 07/14/2020   Encounter for screening colonoscopy    Polyp of sigmoid colon    Plantar fasciitis 07/03/2018   Acute non-recurrent maxillary sinusitis 04/26/2018   Routine physical examination 09/15/2016   Essential hypertension 03/12/2015   Depression 03/12/2015   Low testosterone 03/12/2015   Obesity (BMI 30.0-34.9) 03/12/2015    REFERRING DIAG: left hip pain   THERAPY DIAG:  Pain in left hip  Muscle weakness (generalized)  Difficulty in  walking, not elsewhere classified  Rationale for Evaluation and Treatment: Rehabilitation  PERTINENT HISTORY: L hip pain. Pain began about 5 weeks prior to initial PT eval. Had similar pain months back. Pain started at the top and at the side of his L hip joint. Saw his PCP for a routine checkup and told her about hip pain. 5 months ago, pt was bowling and might have stepped the wrong way affecting his L knee. Was given meloxicam which helped. Feels like his L LE is restless, especially at night. Pt feels L lateral hip and thigh pain running down to his L knee (along IT band and L5 patheway). Just finished his prednisone which helped. 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   PRECAUTIONS: none  SUBJECTIVE:   SUBJECTIVE STATEMENT: Patient reports he is feeling well and has not had any hip pain since last PT session. He feels "we are on to something with the dry needling." He did step ups and hip hikes and monster walks once since last PT session.    PAIN:  Are you having pain? NPRS 0/10 left proximal glute med region.   OBJECTIVE  TODAY'S TREATMENT:      Therapeutic exercise: to centralize symptoms and improve ROM, strength, muscular endurance, and activity  tolerance required for successful completion of functional activities.  - Treadmill 3 mph at 8% grade with B UE support. For improved lower extremity mobility, muscular endurance, and weightbearing activity tolerance; and to induce the analgesic effect of aerobic exercise, stimulate improved joint nutrition, and prepare body structures and systems for following interventions. x 6 minutes. Required assistance to set up machine.  - standing hip hike with contralateral 40lb KB load in farmers carry 3x10 each side with foot on 2 inch step (2 weight plates stacked).  - single leg RDL with 20#KB in contralateral UE and ipsilateral UE support, 3x10 each side. Good form after cuing. Felt strong fatigue in B  hips.  -  SLS rebounder toss, 3x10 each side forwards and 1x10 rotational standing on outside leg, 3kg med ball.   Pt required multimodal cuing for proper technique and to facilitate improved neuromuscular control, strength, range of motion, and functional ability resulting in improved performance and form.  PATIENT EDUCATION: Education details: Exercise purpose/form. Self management techniques. Education on HEP including handout  Reviewed cancelation/no-show policy with patient and confirmed patient has correct phone number for clinic; patient verbalized understanding (03/02/22). Person educated: Patient Education method: Explanation, Corporate treasurer cues, Verbal cues, and Handouts Education comprehension: verbalized understanding, returned demonstration, verbal cues required, and needs further education  HOME EXERCISE PROGRAM: Access Code: [email or web address] URL: [email or web address] Date: 03/09/2022 Prepared by: Rosita Kea  Exercises - Hip Hikes off step  - 1 x daily - 3 sets - 10 reps  HOME EXERCISE PROGRAM [[email or web address]] view at [email or web address]" using code: [email or web address]  Hip Abduction Isometric -  Repeat 10 Times, Hold 5 Seconds, Perform 3 Times a Day  BRIDGING ELASTIC BAND ABDUCTION -  Repeat 10 Times, Hold 5 Seconds, Complete 2 Sets, Perform 1 Times a Day   PT Short Term Goals       PT SHORT TERM GOAL #1   Title Pt will be independent with his initial HEP to decrease pain, improve strength, function, ability to perform work tasks more comfortably.    Baseline Pt has started his initial HEP (02/24/2022)    Time 3    Period Weeks    Status MET 03/02/2022   Target Date 03/18/22              PT Long Term Goals TARGET DATE FOR ALL LONG TERM GOALS: 04/22/2022      PT LONG TERM GOAL #1   Title Pt will have a decrease in L hip pain to 3/10 or less at worst to promote ability to sit and drive for work, ambulate longer distances, and sleep on his L side more comfortably.    Baseline 9/10 L  hip pain at worst for the past 3 months (02/24/2022); minimal pain for the last 2 weeks (04/19/2022);    Time 8    Period Weeks    Status In-Progress   Target Date 04/22/22      PT LONG TERM GOAL #2   Title Pt will improve his hip FOTO score by at least 10 points as a demonstration of improved function.    Baseline L hip FOTO 57 (02/24/2022) 75 at visit 5 (03/29/2022);    Time 8    Period Weeks    Status MET   Target Date      PT LONG TERM GOAL #3   Title Pt will improve bilateral hip extension and abduction strength by at least 1/2 MMT grade to promote ability to perform standing tasks as well as ambulate longer  distances for work more comfortably.    Baseline Hip extension 3+/5 R, 3+/5 L, hip abduction 4-/5 R, 4-/5 L (with symptoms) (02/24/2022). Deferred to next visit(04/19/2022);    Time 8    Period Weeks    Status In-Progress   Target Date              Plan    Clinical Impression Statement Patient arrives with excellent response to dry needling and exercise interventions last week. Session focused on targeted loading of hip abductors and extensors today as well as single leg balance/proprioception. Patient demonstrates improving form and balance but continues to have decreased skill when balancing on left LE compared to right LE. Patient with strong fatigue in bilateral hips and low back by end of session.   Patient has attended 10 physical therapay sessions since starting current episode of care on 02/24/2022. Patient has made good progress towards goals but was still having lingering pain last week. This has now improved after dry needling and manual therapy last week to the left lateral hip. Patient would benefit from demonstration of stability of improvement in symptoms. Plan to consider for discharge next visit or in the next two weeks as long as patient continues to progress at current rate as patient has met or nearly met all of his goals. He does have observable worse balance,  strength, and muscular endurance at the left hip compared to the right. Patient would benefit from continued management of limiting condition by skilled physical therapist to address remaining impairments and functional limitations to work towards stated goals and return to PLOF or maximal functional independence.    Personal Factors and Comorbidities Comorbidity 2;Profession    Comorbidities Depression, HTN    Examination-Activity Limitations Sit;Sleep;Locomotion Level    Stability/Clinical Decision Making Stable/Uncomplicated    Rehab Potential Fair    PT Frequency 2x / week    PT Duration 8 weeks    PT Treatment/Interventions Therapeutic exercise;Neuromuscular re-education;Therapeutic activities;Manual techniques;Patient/family education;Dry needling;Aquatic Therapy;Electrical Stimulation;Iontophoresis '4mg'$ /ml Dexamethasone    PT Next Visit Plan Glute med and min isometrics, then concentric, then eccentric muscle actvation. Manual techniques, modalities PRN    Consulted and Agree with Plan of Care Patient              Nancy Nordmann, PT, DPT 04/19/2022, 8:16 PM  [facility] [address] [place], [place] [address] P: [phone] I F: [phone]

## 2022-04-21 ENCOUNTER — Ambulatory Visit: Payer: Federal, State, Local not specified - PPO | Admitting: Physical Therapy

## 2022-04-21 ENCOUNTER — Encounter: Payer: Self-pay | Admitting: Physical Therapy

## 2022-04-21 DIAGNOSIS — M6281 Muscle weakness (generalized): Secondary | ICD-10-CM

## 2022-04-21 DIAGNOSIS — R262 Difficulty in walking, not elsewhere classified: Secondary | ICD-10-CM

## 2022-04-21 DIAGNOSIS — M25552 Pain in left hip: Secondary | ICD-10-CM | POA: Diagnosis not present

## 2022-04-21 NOTE — Therapy (Signed)
OUTPATIENT PHYSICAL THERAPY TREATMENT NOTE / DISCHARGE SUMMARY Dates of reporting from 02/24/2022 - 04/21/2022   Patient Name: Bernard Blake MRN: [id number] DOB:09/03/1970, 52 y.o., male Today's Date: 04/21/2022  PCP: [name], FNP  REFERRING PROVIDER: [name], FNP  END OF SESSION:  PT End of Session - 04/21/22 1909     Visit Number 11    Number of Visits 17    Date for PT Re-Evaluation 04/22/22    Authorization Type BLUE CROSS BLUE [employer]IELD reporting period from 03/02/2022    Authorization Time Period VL: 38 PT/OT/ST - 22 remain    Authorization - Number of Visits 45    Progress Note Due on Visit 10    PT Start Time 1908    PT Stop Time 1950    PT Time Calculation (min) 42 min    Activity Tolerance Patient tolerated treatment well    Behavior During Therapy [facility] for tasks assessed/performed               Past Medical History:  Diagnosis Date   Allergy    Chicken pox    Depression    Diverticulitis    07/11/20   History of alcohol abuse    Hypertension    Low testosterone    Past Surgical History:  Procedure Laterality Date   ANKLE SURGERY Left    BB removal   COLONOSCOPY WITH PROPOFOL N/A 01/18/2020   Procedure: COLONOSCOPY WITH PROPOFOL;  Surgeon: Virgel Manifold, MD;  Location: [facility] ENDOSCOPY;  Service: Endoscopy;  Laterality: N/A;   Patient Active Problem List   Diagnosis Date Noted   Left hip pain 01/13/2022   Weight loss 03/06/2021   Diverticulitis 07/14/2020   Encounter for screening colonoscopy    Polyp of sigmoid colon    Plantar fasciitis 07/03/2018   Acute non-recurrent maxillary sinusitis 04/26/2018   Routine physical examination 09/15/2016   Essential hypertension 03/12/2015   Depression 03/12/2015   Low testosterone 03/12/2015   Obesity (BMI 30.0-34.9) 03/12/2015    REFERRING DIAG: left hip pain   THERAPY DIAG:  Pain in left hip  Muscle weakness (generalized)  Difficulty in walking, not elsewhere  classified  Rationale for Evaluation and Treatment: Rehabilitation  PERTINENT HISTORY: L hip pain. Pain began about 5 weeks prior to initial PT eval. Had similar pain months back. Pain started at the top and at the side of his L hip joint. Saw his PCP for a routine checkup and told her about hip pain. 5 months ago, pt was bowling and might have stepped the wrong way affecting his L knee. Was given meloxicam which helped. Feels like his L LE is restless, especially at night. Pt feels L lateral hip and thigh pain running down to his L knee (along IT band and L5 patheway). Just finished his prednisone which helped. 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   PRECAUTIONS: none  SUBJECTIVE:   SUBJECTIVE STATEMENT: Patient reports he thought about cancelling his appointment today because he was so sore after last PT session. He states he has not had concordant hip pain for a week. He did have some pain down his lateral lower left leg on Sunday night that he thought must be coming from his back. He had a lot of soreness in his bilateral glutes and low back.  PAIN:  Are you having pain? NPRS 0/10 left proximal glute med region.   OBJECTIVE  SELF-REPORTED FUNCTION FOTO score: 83/100 (hip questionnaire)  STRENGTH:  Hip  Flexion: R = 4+*/5, L = 5/5. Extension: R = 4*/5, L = 5/5. Abduction: R = 5/5, L = 4+/5. IR: R = 5/5*, L = 5/5 ER: R = 5/5, L = 5/5   TODAY'S TREATMENT:      Therapeutic exercise: to centralize symptoms and improve ROM, strength, muscular endurance, and activity tolerance required for successful completion of functional activities.  - testing to assess progress (see above).  - standing hip hike with SilverTB anchored at bottom of 12 inch step, 2x10 each side with UE support as needed.  - standing lateral heel tap off 8 inch step, 1x10 each side.  - Education on HEP including handout, POC, discharge recommendations.   Pt required multimodal cuing  for proper technique and to facilitate improved neuromuscular control, strength, range of motion, and functional ability resulting in improved performance and form.  PATIENT EDUCATION: Education details: Exercise purpose/form. Self management techniques. Education on HEP including handout. Discharge recommendations.  Reviewed cancelation/no-show policy with patient and confirmed patient has correct phone number for clinic; patient verbalized understanding (03/02/22). Person educated: Patient Education method: Explanation, Corporate treasurer cues, Verbal cues, and Handouts Education comprehension: verbalized understanding, returned demonstration, verbal cues required, and needs further education  HOME EXERCISE PROGRAM: Access Code: [email or web address] URL: [email or web address] Date: 04/21/2022 Prepared by: Rosita Kea  Exercises - Hip Hikes off step  - 3 x weekly - 3 sets - 10-15 reps - Lateral Step Down  - 3 x weekly - 1-3 sets - 10 reps - Runner's Step Up/Down  - 1 x daily - 3 x weekly - 3 sets - 10 reps      PT Short Term Goals       PT SHORT TERM GOAL #1   Title Pt will be independent with his initial HEP to decrease pain, improve strength, function, ability to perform work tasks more comfortably.    Baseline Pt has started his initial HEP (02/24/2022)    Time 3    Period Weeks    Status MET 03/02/2022   Target Date 03/18/22              PT Long Term Goals TARGET DATE FOR ALL LONG TERM GOALS: 04/22/2022      PT LONG TERM GOAL #1   Title Pt will have a decrease in L hip pain to 3/10 or less at worst to promote ability to sit and drive for work, ambulate longer distances, and sleep on his L side more comfortably.    Baseline 9/10 L hip pain at worst for the past 3 months (02/24/2022); minimal pain for the last 2 weeks (04/19/2022); none in the last 1 week and between half and zero out of 10 in the last 2 weeks (04/21/2022);    Time 8    Period Weeks    Status MET   Target Date  04/22/22      PT LONG TERM GOAL #2   Title Pt will improve his hip FOTO score by at least 10 points as a demonstration of improved function.    Baseline L hip FOTO 57 (02/24/2022) 75 at visit 5 (03/29/2022);    Time 8    Period Weeks    Status MET   Target Date      PT LONG TERM GOAL #3   Title Pt will improve bilateral hip extension and abduction strength by at least 1/2 MMT grade to promote ability to perform standing tasks as well as ambulate longer distances for work  more comfortably.    Baseline Hip extension 3+/5 R, 3+/5 L, hip abduction 4-/5 R, 4-/5 L (with symptoms) (02/24/2022). Deferred to next visit (04/19/2022); Hip extension 4/5 R, 5/5 L, hip abduction 4+/5 R, 5/5 L no concordant symptoms   Time 8    Period Weeks    Status MET   Target Date              Plan    Clinical Impression Statement Patient has attended 11 physical therapy sessions since starting current episode of care on 02/24/2022. Patient has demonstrated excellent participation in clinic and in [place]. He has met all of his goals and is therefore being discharged to self-management with long term HEP. He was advised to update PT in 2 weeks in case of return of symptoms.    Personal Factors and Comorbidities Comorbidity 2;Profession    Comorbidities Depression, HTN    Examination-Activity Limitations Sit;Sleep;Locomotion Level    Stability/Clinical Decision Making Stable/Uncomplicated    Rehab Potential Fair    PT Frequency 2x / week    PT Duration 8 weeks    PT Treatment/Interventions Therapeutic exercise;Neuromuscular re-education;Therapeutic activities;Manual techniques;Patient/family education;Dry needling;Aquatic Therapy;Electrical Stimulation;Iontophoresis '4mg'$ /ml Dexamethasone    PT Next Visit Plan Tentative discharge from PT (check in by phone in 2 weeks).    Consulted and Agree with Plan of Care Patient              Nancy Nordmann, PT, DPT 04/21/2022, 8:05 PM  [facility] [address] [place], [place] [address] P: [phone] I F: [phone]

## 2022-04-26 ENCOUNTER — Ambulatory Visit: Payer: Federal, State, Local not specified - PPO | Admitting: Physical Therapy

## 2022-04-28 ENCOUNTER — Ambulatory Visit: Payer: Federal, State, Local not specified - PPO | Admitting: Physical Therapy

## 2022-05-19 ENCOUNTER — Encounter: Payer: Self-pay | Admitting: Family

## 2022-05-19 ENCOUNTER — Ambulatory Visit: Payer: Federal, State, Local not specified - PPO | Admitting: Family

## 2022-05-19 VITALS — BP 128/84 | HR 89 | Temp 98.5°F | Ht 74.0 in | Wt 258.2 lb

## 2022-05-19 DIAGNOSIS — R7989 Other specified abnormal findings of blood chemistry: Secondary | ICD-10-CM | POA: Diagnosis not present

## 2022-05-19 DIAGNOSIS — R7303 Prediabetes: Secondary | ICD-10-CM | POA: Diagnosis not present

## 2022-05-19 DIAGNOSIS — Z13220 Encounter for screening for lipoid disorders: Secondary | ICD-10-CM

## 2022-05-19 DIAGNOSIS — F32A Depression, unspecified: Secondary | ICD-10-CM

## 2022-05-19 DIAGNOSIS — Z125 Encounter for screening for malignant neoplasm of prostate: Secondary | ICD-10-CM

## 2022-05-19 DIAGNOSIS — Z1211 Encounter for screening for malignant neoplasm of colon: Secondary | ICD-10-CM

## 2022-05-19 NOTE — Assessment & Plan Note (Signed)
Chronic, stable.  Continue Celexa 20 mg daily

## 2022-05-19 NOTE — Progress Notes (Signed)
Assessment & Plan:  Encounter for lipid screening for cardiovascular disease -     Lipid panel; Future -     Comprehensive metabolic panel; Future  Screen for colon cancer -     Ambulatory referral to Gastroenterology  Screening for prostate cancer -     PSA; Future  Prediabetes -     Hemoglobin A1c; Future  Low testosterone -     Ambulatory referral to Urology  Depression, unspecified depression type Assessment & Plan: Chronic, stable.  Continue Celexa 20 mg daily      Return precautions given.   Risks, benefits, and alternatives of the medications and treatment plan prescribed today were discussed, and patient expressed understanding.   Education regarding symptom management and diagnosis given to patient on AVS either electronically or printed.  Return in about 6 months (around 11/17/2022) for Fasting labs in 2-3 weeks.  Bernard Paris, FNP  Subjective:    Patient ID: Bernard Blake, male    DOB: 03-14-1971, 52 y.o.   MRN: [id number]  CC: [name] is a 52 y.o. male who presents today for follow up.   HPI:  Left hip pain has improved  He has seen Dr Bernard Blake and completed PT.   He is not taking wellbutrin. He is taking celexa '20mg'$  and feels working well for him.   He has started playing disc golf   No trouble urinating, decreased urine stream  Previously followed with Dr. Eliberto Blake for testosterone replacement   Previously had seen Dr Bernard Blake, urology   Allergies: Tetanus toxoids Current Outpatient Medications on File Prior to Visit  Medication Sig Dispense Refill   Ascorbic Acid (VITAMIN C) 1000 MG tablet Take 2,000 mg by mouth daily.     Cetirizine HCl (ZYRTEC PO) Take by mouth.     cholecalciferol (VITAMIN D) 1000 UNITS tablet Take 2,000 Units by mouth daily.     citalopram (CELEXA) 20 MG tablet Take 1 tablet (20 mg total) by mouth daily. 90 tablet 3   Co-Enzyme Q-10 100 MG CAPS Take 100 mg by mouth.     JATENZO 237 MG CAPS Take 1 capsule by  mouth 2 (two) times daily.     Loratadine (CLARITIN PO) Take by mouth.     meloxicam (MOBIC) 7.5 MG tablet Take 1 tablet (7.5 mg total) by mouth daily as needed for pain. 30 tablet 2   Omega-3 Fatty Acids (FISH OIL) 1000 MG CAPS Take 2,400 mg by mouth once.     Zinc Sulfate 66 MG TABS Take 15 mg by mouth.     No current facility-administered medications on file prior to visit.    Review of Systems  Constitutional:  Negative for chills and fever.  Respiratory:  Negative for cough.   Cardiovascular:  Negative for chest pain and palpitations.  Gastrointestinal:  Negative for nausea and vomiting.      Objective:    BP 128/84   Pulse 89   Temp 98.5 F (36.9 C) (Oral)   Ht '6\' 2"'$  (1.88 m)   Wt 258 lb 3.2 oz (117.1 kg)   SpO2 95%   BMI 33.15 kg/m  BP Readings from Last 3 Encounters:  05/19/22 128/84  01/13/22 128/76  07/08/21 126/78   Wt Readings from Last 3 Encounters:  05/19/22 258 lb 3.2 oz (117.1 kg)  01/13/22 257 lb (116.6 kg)  07/08/21 254 lb 6.4 oz (115.4 kg)    Physical Exam Vitals reviewed.  Constitutional:      Appearance: He is well-developed.  Cardiovascular:     Rate and Rhythm: Regular rhythm.     Heart sounds: Normal heart sounds.  Pulmonary:     Effort: Pulmonary effort is normal. No respiratory distress.     Breath sounds: Normal breath sounds. No wheezing, rhonchi or rales.  Skin:    General: Skin is warm and dry.  Neurological:     Mental Status: He is alert.  Psychiatric:        Speech: Speech normal.        Behavior: Behavior normal.

## 2022-05-19 NOTE — Patient Instructions (Signed)
Referral gastroenterology, urology  Let us know if you dont hear back within a week in regards to an appointment being scheduled.   So that you are aware, if you are [facility] MyChart user , please pay attention to your MyChart messages as you may receive a MyChart message with a phone number to call and schedule this test/appointment own your own from our referral coordinator. This is a new process so I do not want you to miss this message.  If you are not a MyChart user, you will receive a phone call.

## 2022-05-31 ENCOUNTER — Encounter: Payer: Self-pay | Admitting: *Deleted

## 2022-06-02 ENCOUNTER — Other Ambulatory Visit: Payer: Federal, State, Local not specified - PPO

## 2022-06-04 ENCOUNTER — Other Ambulatory Visit: Payer: Self-pay | Admitting: Family

## 2022-06-04 DIAGNOSIS — M25552 Pain in left hip: Secondary | ICD-10-CM

## 2022-06-23 ENCOUNTER — Ambulatory Visit: Payer: Federal, State, Local not specified - PPO | Admitting: Urology

## 2022-06-23 ENCOUNTER — Encounter: Payer: Self-pay | Admitting: Urology

## 2022-06-23 VITALS — BP 144/87 | HR 65 | Ht 74.0 in | Wt 255.0 lb

## 2022-06-23 DIAGNOSIS — Z125 Encounter for screening for malignant neoplasm of prostate: Secondary | ICD-10-CM

## 2022-06-23 DIAGNOSIS — E291 Testicular hypofunction: Secondary | ICD-10-CM | POA: Diagnosis not present

## 2022-06-23 NOTE — Progress Notes (Signed)
I, Bernard Blake,acting as a scribe for Bernard Sons, MD.,have documented all relevant documentation on the behalf of Bernard Sons, MD,as directed by  Bernard Sons, MD while in the presence of Bernard Sons, MD.   06/23/22 4:15 PM   Bernard Blake 12-27-1970 [id number]  Referring provider: [name], FNP [address] [place],  [place] [address]  Chief Complaint  Patient presents with   Hypogonadism    HPI: Bernard Blake is a 52 y.o. male who is transferring urologic care after recent retirement of his previous urologist.  He last saw Dr. Yves Dill 12/07/21 and was followed for ED and hypogonadism. He is taking Jatenzo 237 mg twice daily with no complaints.  Labs 12/07/21: H&H 16.8/49.6; testosterone 319; PSA 0.7 He was also given a prescription for generic sildenafil for ED but has not used this medication.  PMH: Past Medical History:  Diagnosis Date   Allergy    Chicken pox    Depression    Diverticulitis    07/11/20   History of alcohol abuse    Hypertension    Low testosterone     Surgical History: Past Surgical History:  Procedure Laterality Date   ANKLE SURGERY Left    BB removal   COLONOSCOPY WITH PROPOFOL N/A 01/18/2020   Procedure: COLONOSCOPY WITH PROPOFOL;  Surgeon: Bernard Manifold, MD;  Location: [facility] ENDOSCOPY;  Service: Endoscopy;  Laterality: N/A;    Home Medications:  Allergies as of 06/23/2022       Reactions   Tetanus Toxoids Swelling        Medication List        Accurate as of June 23, 2022  4:15 PM. If you have any questions, ask your nurse or doctor.          cholecalciferol 1000 units tablet Commonly known as: VITAMIN D Take 2,000 Units by mouth daily.   citalopram 20 MG tablet Commonly known as: CELEXA Take 1 tablet (20 mg total) by mouth daily.   CLARITIN PO Take by mouth.   Co-Enzyme Q-10 100 MG Caps Take 100 mg by mouth.   Fish Oil 1000 MG Caps Take 2,400 mg by mouth once.    Jatenzo 237 MG Caps Generic drug: Testosterone Undecanoate Take 1 capsule by mouth 2 (two) times daily.   meloxicam 7.5 MG tablet Commonly known as: MOBIC TAKE 1 TABLET(7.5 MG) BY MOUTH DAILY AS NEEDED FOR PAIN   vitamin C 1000 MG tablet Take 2,000 mg by mouth daily.   Zinc Sulfate 66 MG Tabs Take 15 mg by mouth.   ZYRTEC PO Take by mouth.        Allergies:  Allergies  Allergen Reactions   Tetanus Toxoids Swelling    Family History: Family History  Problem Relation Age of Onset   Arthritis Mother    Hyperlipidemia Mother    Hypertension Mother    Arthritis Father    Hyperlipidemia Father    Hypertension Father    Diabetes Father     Social History:  reports that he has quit smoking. His smoking use included cigarettes. He has a 8.00 pack-year smoking history. His smokeless tobacco use includes snuff. He reports that he does not drink alcohol and does not use drugs.   Physical Exam: BP (!) 144/87   Pulse 65   Ht 6\' 2"  (1.88 m)   Wt 255 lb (115.7 kg)   BMI 32.74 kg/m   Constitutional:  Alert and oriented, No acute  distress. HEENT: [place] AT, moist mucus membranes.  Trachea midline, no masses. Cardiovascular: No clubbing, cyanosis, or edema. Respiratory: Normal respiratory effort, no increased work of breathing. GI: Abdomen is soft, nontender, nondistended, no abdominal masses Skin: No rashes, bruises or suspicious lesions. Neurologic: Grossly intact, no focal deficits, moving all 4 extremities. Psychiatric: Normal mood and affect.  Laboratory Data: Lab Results  Component Value Date   WBC 5.6 05/19/2021   HGB 15.9 05/19/2021   HCT 47.8 05/19/2021   MCV 84.7 05/19/2021   PLT 253.0 05/19/2021    Lab Results  Component Value Date   CREATININE 0.83 03/04/2021    Lab Results  Component Value Date   PSA 0.89 01/17/2020   PSA 0.98 03/12/2015    Lab Results  Component Value Date   HGBA1C 5.4 07/08/2021    Urinalysis    Component Value Date/Time    APPEARANCEUR Clear 07/11/2020 1131   GLUCOSEU Negative 07/11/2020 1131   BILIRUBINUR Negative 07/11/2020 1131   PROTEINUR Trace 07/11/2020 1131   NITRITE Negative 07/11/2020 1131   LEUKOCYTESUR Negative 07/11/2020 1131    Lab Results  Component Value Date   LABMICR Comment 07/11/2020     Assessment & Plan:    Hypogonadism  Jatenzo refilled PSA, testosterone, hematocrit drawn today Lab visit 6 months for testosterone/hematocrit  Office visit in 1 year with testosterone, PSA, hematocrit   [facility] [address] [place], [place] [address] [phone]

## 2022-06-24 LAB — TESTOSTERONE: Testosterone: 180 ng/dL — ABNORMAL LOW (ref 264–916)

## 2022-06-24 LAB — PSA: Prostate Specific Ag, Serum: 1.1 ng/mL (ref 0.0–4.0)

## 2022-06-24 LAB — HEMATOCRIT: Hematocrit: 46.4 % (ref 37.5–51.0)

## 2022-06-24 MED ORDER — JATENZO 237 MG PO CAPS: 1.0000 | ORAL_CAPSULE | Freq: Two times a day (BID) | ORAL | 3 refills | Status: DC

## 2022-06-28 ENCOUNTER — Encounter: Payer: Self-pay | Admitting: *Deleted

## 2022-07-09 ENCOUNTER — Other Ambulatory Visit (INDEPENDENT_AMBULATORY_CARE_PROVIDER_SITE_OTHER): Payer: Federal, State, Local not specified - PPO

## 2022-07-09 DIAGNOSIS — Z13220 Encounter for screening for lipoid disorders: Secondary | ICD-10-CM | POA: Diagnosis not present

## 2022-07-09 DIAGNOSIS — Z125 Encounter for screening for malignant neoplasm of prostate: Secondary | ICD-10-CM | POA: Diagnosis not present

## 2022-07-09 DIAGNOSIS — Z136 Encounter for screening for cardiovascular disorders: Secondary | ICD-10-CM

## 2022-07-09 DIAGNOSIS — R7303 Prediabetes: Secondary | ICD-10-CM

## 2022-07-09 LAB — COMPREHENSIVE METABOLIC PANEL
ALT: 28 U/L (ref 0–53)
AST: 24 U/L (ref 0–37)
Albumin: 4.5 g/dL (ref 3.5–5.2)
Alkaline Phosphatase: 63 U/L (ref 39–117)
BUN: 20 mg/dL (ref 6–23)
CO2: 29 mEq/L (ref 19–32)
Calcium: 9.2 mg/dL (ref 8.4–10.5)
Chloride: 103 mEq/L (ref 96–112)
Creatinine, Ser: 0.92 mg/dL (ref 0.40–1.50)
GFR: 94.83 mL/min (ref >60.00)
Glucose, Bld: 124 mg/dL — ABNORMAL HIGH (ref 70–99)
Potassium: 4 mEq/L (ref 3.5–5.1)
Sodium: 141 mEq/L (ref 135–145)
Total Bilirubin: 0.5 mg/dL (ref 0.2–1.2)
Total Protein: 6.5 g/dL (ref 6.0–8.3)

## 2022-07-09 LAB — PSA: PSA: 1.15 ng/mL (ref 0.10–4.00)

## 2022-07-09 LAB — LIPID PANEL
Cholesterol: 139 mg/dL (ref 0–200)
HDL: 36.8 mg/dL — ABNORMAL LOW (ref >39.00)
LDL Cholesterol: 81 mg/dL (ref 0–99)
NonHDL: 101.77
Total CHOL/HDL Ratio: 4
Triglycerides: 106 mg/dL (ref 0.0–149.0)
VLDL: 21.2 mg/dL (ref 0.0–40.0)

## 2022-07-09 LAB — HEMOGLOBIN A1C: Hgb A1c MFr Bld: 6.1 % (ref 4.6–6.5)

## 2022-07-12 ENCOUNTER — Encounter: Payer: Self-pay | Admitting: Family

## 2022-07-13 ENCOUNTER — Other Ambulatory Visit: Payer: Self-pay

## 2022-07-13 ENCOUNTER — Other Ambulatory Visit (INDEPENDENT_AMBULATORY_CARE_PROVIDER_SITE_OTHER): Payer: Federal, State, Local not specified - PPO

## 2022-07-13 DIAGNOSIS — I10 Essential (primary) hypertension: Secondary | ICD-10-CM

## 2022-07-13 LAB — TSH: TSH: 1.84 u[IU]/mL (ref 0.35–5.50)

## 2022-07-13 NOTE — Telephone Encounter (Signed)
TSH ordered pt is scheduled for lab appt 07/13/22

## 2022-07-23 ENCOUNTER — Other Ambulatory Visit: Payer: Federal, State, Local not specified - PPO

## 2022-07-29 DIAGNOSIS — M79673 Pain in unspecified foot: Secondary | ICD-10-CM

## 2022-08-01 IMAGING — MR MR ANKLE*R* W/O CM
5 series · 40 of 40 positions shown · non-contrast
Comparison: None.

CLINICAL DATA: Burning sensation in the posterior and medial right
heel for 6-8 months. No known injury.

EXAM:
MRI OF THE RIGHT ANKLE WITHOUT CONTRAST
TECHNIQUE: Multiplanar, multisequence MR imaging of the ankle was performed. No
intravenous contrast was administered.

[Series 4: PD fat-sat · axial · right · 3.0mm · 0.50mm/px · z∈[-61,+78]mm · 10 of 36 slices shown]
[im 1/36]
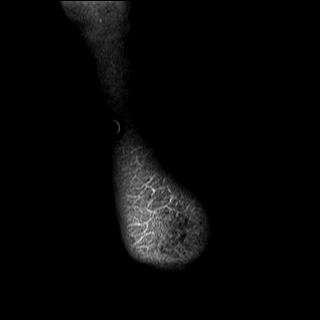
[im 4/36]
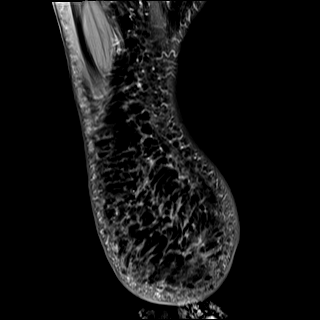
[im 8/36]
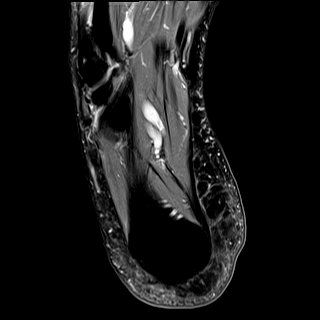
[im 12/36]
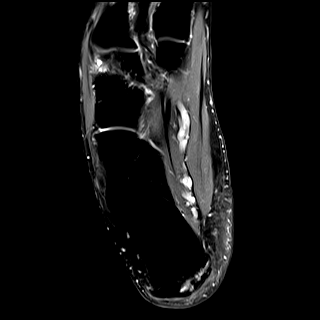
[im 16/36]
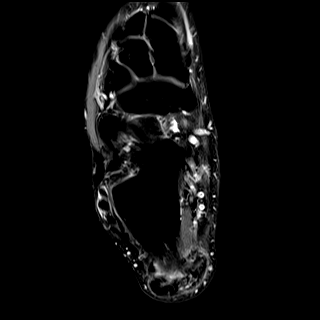
[im 20/36]
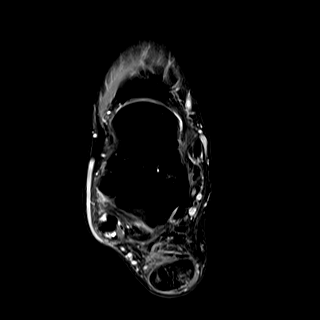
[im 24/36]
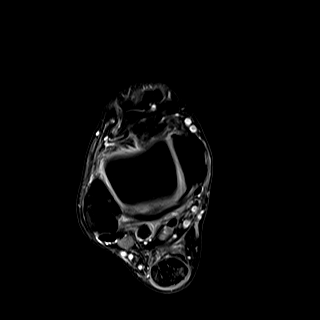
[im 28/36]
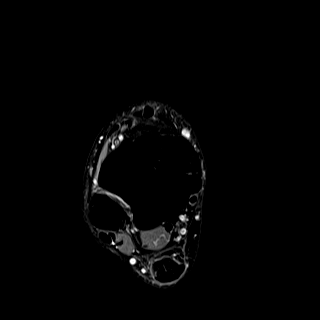
[im 32/36]
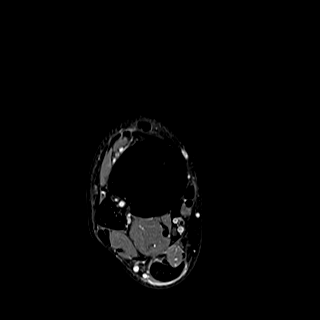
[im 36/36]
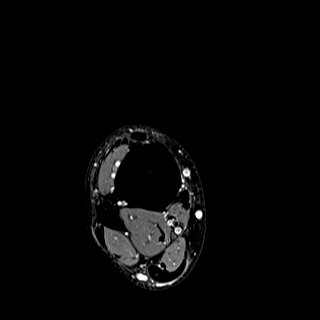

[Series 5: T2 fat-sat · axial · right · 3.0mm · 0.50mm/px · z∈[-61,+78]mm · 10 of 36 slices shown (1 of 2)]
[im 1/36]
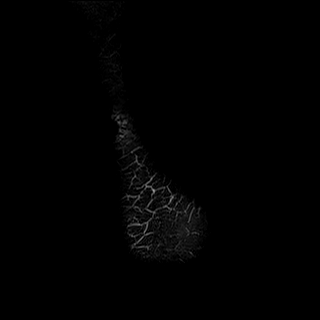
[im 4/36]
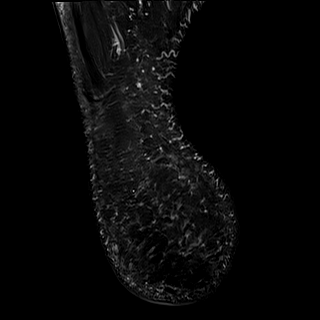
[im 8/36]
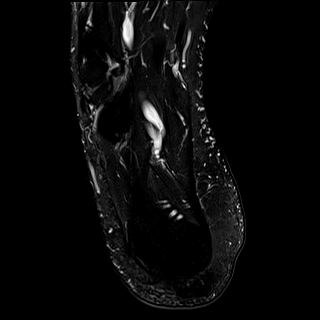
[im 12/36]
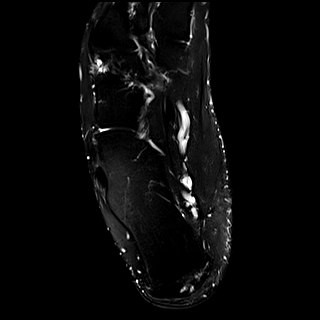
[im 16/36]
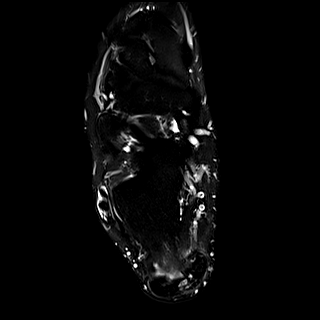
[im 20/36]
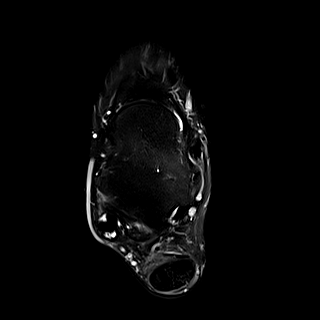
[im 24/36]
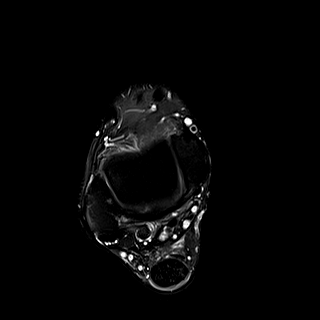
[im 28/36]
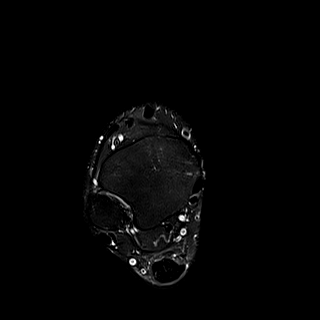
[im 32/36]
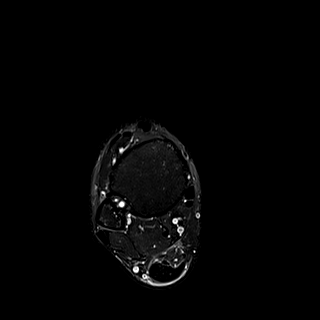
[im 36/36]
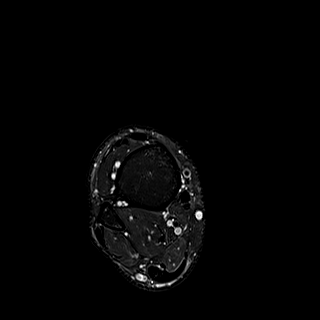

[Series 6: T2 fat-sat · coronal · right · 3.0mm · 0.66mm/px · 10 of 40 slices shown (2 of 2)]
[im 1/40]
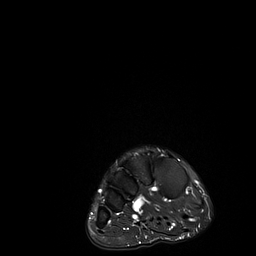
[im 5/40]
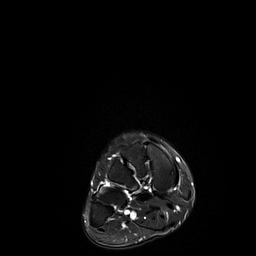
[im 9/40]
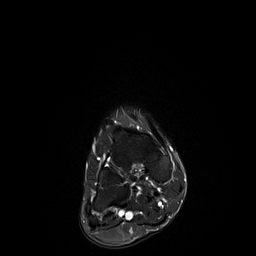
[im 14/40]
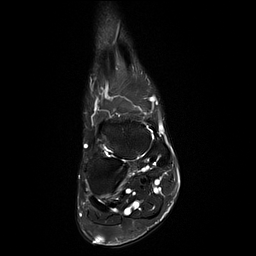
[im 18/40]
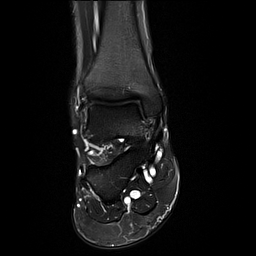
[im 22/40]
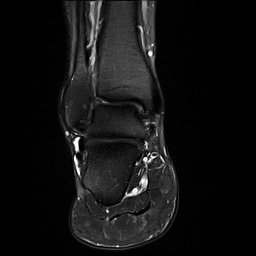
[im 27/40]
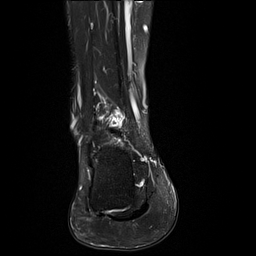
[im 31/40]
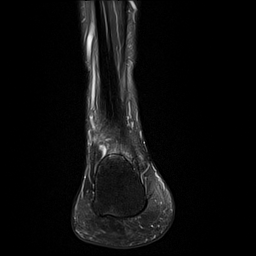
[im 35/40]
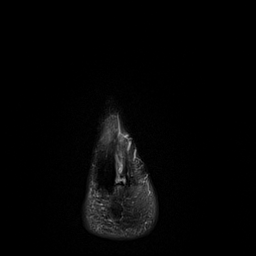
[im 40/40]
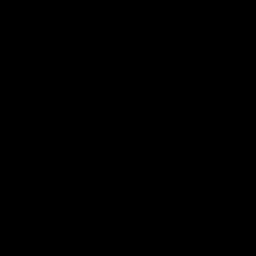

[Series 7: T1 · sagittal · right · 4.0mm · 0.70mm/px · 5 of 21 slices shown]
[im 1/21]
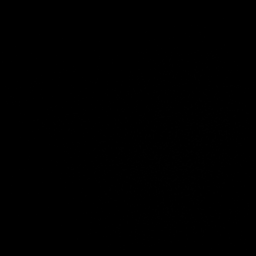
[im 6/21]
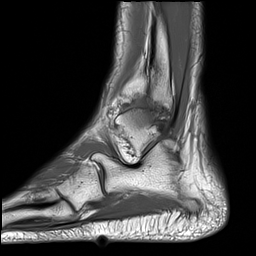
[im 11/21]
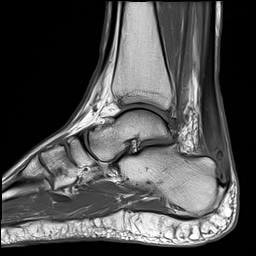
[im 16/21]
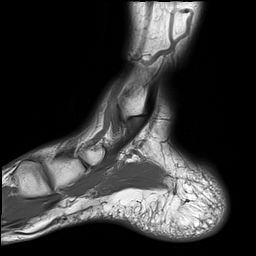
[im 21/21]
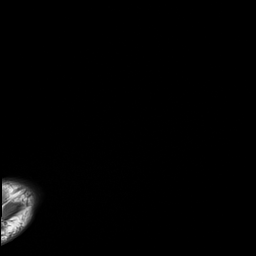

[Series 8: STIR · sagittal · right · 4.0mm · 0.35mm/px · 5 of 21 slices shown]
[im 1/21]
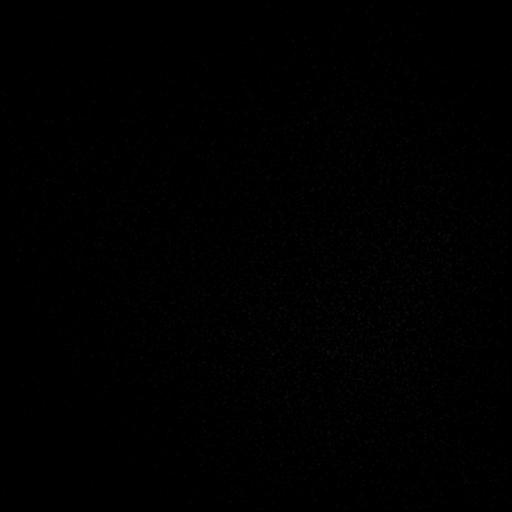
[im 6/21]
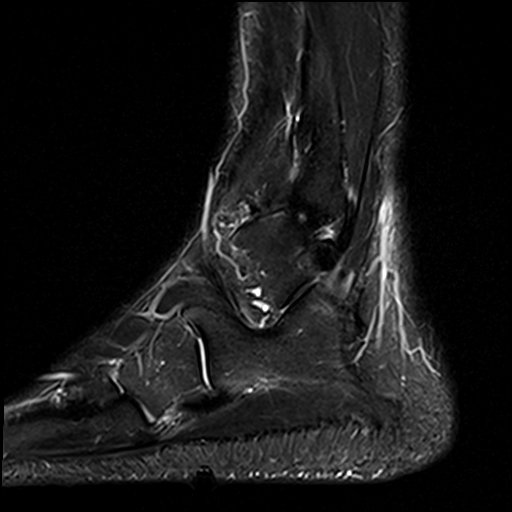
[im 11/21]
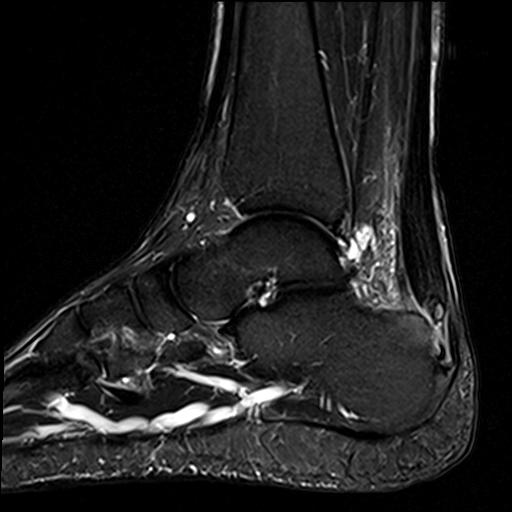
[im 16/21]
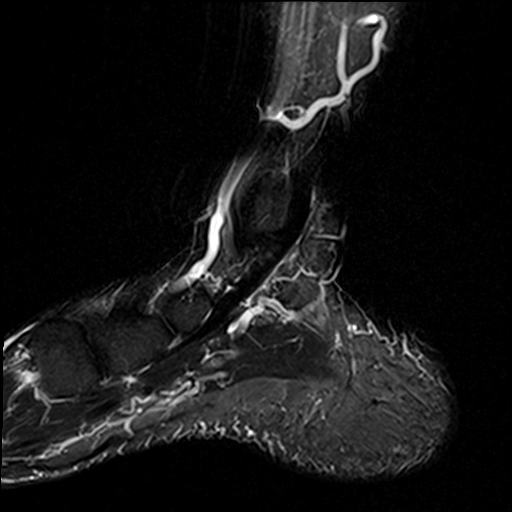
[im 21/21]
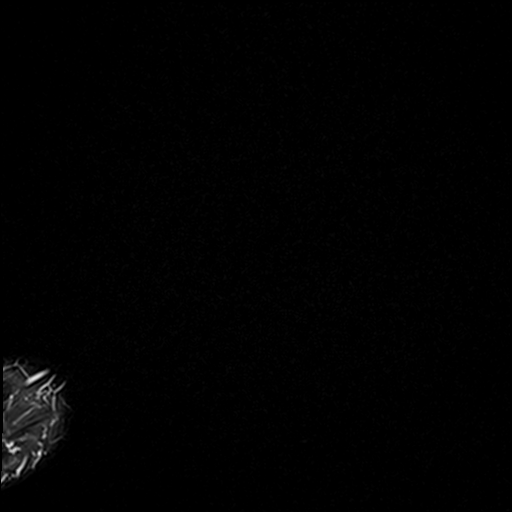

[40 of 40 positions shown; findings below may reference images not displayed]

FINDINGS: TENDONS

Peroneal: Intact. There is some fluid in the sheaths of the tendons
compatible with tenosynovitis.

Posteromedial: Intact.

Anterior: Intact.

Achilles: The patient has severe insertional Achilles tendinopathy.
The distal tendon is markedly thickened with intrasubstance
increased T2 signal. A deep undersurface tear of the central fibers
of the tendon measures 0.8 cm from transverse. Small calcifications
are seen in the distal most tendon. Mild reactive marrow edema in
the dorsal calcaneus is present. There is a small volume of fluid in
the subacromial/subdeltoid bursa and edema in Kager's fat.

Plantar Fascia: Intact with normal signal. Plantar calcaneal spur
noted.

LIGAMENTS

Lateral: Intact.

Medial: Intact.

CARTILAGE

Ankle Joint: Osteochondral lesion of the lateral talar dome measures
0.5 cm transverse by 0.7 cm from front to back. Minimal underlying
subchondral edema is present.

Subtalar Joints/Sinus Tarsi: Normal.

Bones: No fracture or worrisome lesion.

Other: None.
IMPRESSION: Severe insertional Achilles tendinopathy with a near full-thickness
tear of the central fibers of the tendon measuring 0.8 cm
transverse. There is little to no retraction. Mild reactive marrow
edema in the calcaneus at the Achilles tendon insertion and a small
volume of fluid the retrocalcaneal bursa consistent with bursitis
are also noted.

Small osteochondral lesion of the medial talar dome as described.

Small volume of fluid in the sheaths of the peroneal tendons
compatible with tenosynovitis without tear.

## 2022-08-26 ENCOUNTER — Other Ambulatory Visit: Payer: Self-pay | Admitting: Family

## 2022-08-26 DIAGNOSIS — M25552 Pain in left hip: Secondary | ICD-10-CM

## 2022-10-15 ENCOUNTER — Encounter: Payer: Self-pay | Admitting: Family

## 2022-10-15 DIAGNOSIS — Z1152 Encounter for screening for COVID-19: Secondary | ICD-10-CM | POA: Diagnosis not present

## 2022-10-15 DIAGNOSIS — R309 Painful micturition, unspecified: Secondary | ICD-10-CM | POA: Diagnosis not present

## 2022-10-15 DIAGNOSIS — R1031 Right lower quadrant pain: Secondary | ICD-10-CM | POA: Diagnosis not present

## 2022-10-15 DIAGNOSIS — R809 Proteinuria, unspecified: Secondary | ICD-10-CM | POA: Diagnosis not present

## 2022-10-15 DIAGNOSIS — K5720 Diverticulitis of large intestine with perforation and abscess without bleeding: Secondary | ICD-10-CM | POA: Diagnosis not present

## 2022-10-15 DIAGNOSIS — Z20822 Contact with and (suspected) exposure to covid-19: Secondary | ICD-10-CM | POA: Diagnosis not present

## 2022-10-15 DIAGNOSIS — R110 Nausea: Secondary | ICD-10-CM | POA: Diagnosis not present

## 2022-10-15 NOTE — Telephone Encounter (Signed)
Spoke to pt and encouraged him to go to UC so at least he is seen today. Pt stated that he would go to UC to get checked out because Diverticulitis can be life threatening. Pt verbalized understanding

## 2022-10-16 DIAGNOSIS — F32A Depression, unspecified: Secondary | ICD-10-CM | POA: Diagnosis not present

## 2022-10-16 DIAGNOSIS — R110 Nausea: Secondary | ICD-10-CM | POA: Diagnosis not present

## 2022-10-16 DIAGNOSIS — R109 Unspecified abdominal pain: Secondary | ICD-10-CM | POA: Diagnosis not present

## 2022-10-16 DIAGNOSIS — Z79899 Other long term (current) drug therapy: Secondary | ICD-10-CM | POA: Diagnosis not present

## 2022-10-16 DIAGNOSIS — K5732 Diverticulitis of large intestine without perforation or abscess without bleeding: Secondary | ICD-10-CM | POA: Diagnosis not present

## 2022-10-16 DIAGNOSIS — R309 Painful micturition, unspecified: Secondary | ICD-10-CM | POA: Diagnosis not present

## 2022-10-16 DIAGNOSIS — K5720 Diverticulitis of large intestine with perforation and abscess without bleeding: Secondary | ICD-10-CM | POA: Diagnosis not present

## 2022-10-16 DIAGNOSIS — R1031 Right lower quadrant pain: Secondary | ICD-10-CM | POA: Diagnosis not present

## 2022-10-17 DIAGNOSIS — K5720 Diverticulitis of large intestine with perforation and abscess without bleeding: Secondary | ICD-10-CM | POA: Diagnosis not present

## 2022-10-18 DIAGNOSIS — K5720 Diverticulitis of large intestine with perforation and abscess without bleeding: Secondary | ICD-10-CM | POA: Diagnosis not present

## 2022-10-19 DIAGNOSIS — K5720 Diverticulitis of large intestine with perforation and abscess without bleeding: Secondary | ICD-10-CM | POA: Diagnosis not present

## 2022-10-20 ENCOUNTER — Encounter (INDEPENDENT_AMBULATORY_CARE_PROVIDER_SITE_OTHER): Payer: Self-pay

## 2022-10-20 DIAGNOSIS — K5720 Diverticulitis of large intestine with perforation and abscess without bleeding: Secondary | ICD-10-CM | POA: Diagnosis not present

## 2022-10-21 ENCOUNTER — Telehealth: Payer: Self-pay | Admitting: *Deleted

## 2022-10-21 NOTE — Transitions of Care (Post Inpatient/ED Visit) (Signed)
   10/21/2022  Name: Bernard Blake MRN: [id number] DOB: 11/16/1970  Today's TOC FU Call Status: Today's TOC FU Call Status:: Unsuccessful Call (1st Attempt) Unsuccessful Call (1st Attempt) Date: 10/21/22  Attempted to reach the patient regarding the most recent Inpatient/ED visit.  Follow Up Plan: Additional outreach attempts will be made to reach the patient to complete the Transitions of Care (Post Inpatient/ED visit) call.   Gean Maidens BSN RN [facility] [phone]

## 2022-10-22 ENCOUNTER — Telehealth: Payer: Self-pay | Admitting: *Deleted

## 2022-10-22 NOTE — Transitions of Care (Post Inpatient/ED Visit) (Signed)
   10/22/2022  Name: Bernard Blake MRN: [id number] DOB: [birth year]  Today's TOC FU Call Status: Today's TOC FU Call Status:: Unsuccessful Call (2nd Attempt) Unsuccessful Call (2nd Attempt) Date: 10/22/22  Attempted to reach the patient regarding the most recent Inpatient/ED visit.  Follow Up Plan: Additional outreach attempts will be made to reach the patient to complete the Transitions of Care (Post Inpatient/ED visit) call.   Gean Maidens BSN RN [facility] [phone]

## 2022-10-25 ENCOUNTER — Ambulatory Visit: Payer: Federal, State, Local not specified - PPO | Admitting: Family

## 2022-10-25 ENCOUNTER — Encounter: Payer: Self-pay | Admitting: Family

## 2022-10-25 VITALS — BP 122/84 | HR 61 | Ht 74.0 in | Wt 251.6 lb

## 2022-10-25 DIAGNOSIS — R300 Dysuria: Secondary | ICD-10-CM | POA: Diagnosis not present

## 2022-10-25 DIAGNOSIS — R7303 Prediabetes: Secondary | ICD-10-CM | POA: Diagnosis not present

## 2022-10-25 DIAGNOSIS — K5792 Diverticulitis of intestine, part unspecified, without perforation or abscess without bleeding: Secondary | ICD-10-CM

## 2022-10-25 DIAGNOSIS — E785 Hyperlipidemia, unspecified: Secondary | ICD-10-CM

## 2022-10-25 LAB — POCT GLYCOSYLATED HEMOGLOBIN (HGB A1C): Hemoglobin A1C: 5.8 % — AB (ref 4.0–5.6)

## 2022-10-25 NOTE — Assessment & Plan Note (Signed)
Reviewed [facility] course.  Medications reconciled.  Patient is advancing diet as tolerated.  Experiencing episodic urinary symptoms, pending urinalysis, urine culture.  He has follow-up scheduled with gastroenterology tomorrow to discuss elective sigmoid colectomy.  Will follow.

## 2022-10-25 NOTE — Progress Notes (Signed)
Assessment & Plan:  Prediabetes -     POCT glycosylated hemoglobin (Hb A1C)  Dyslipidemia  Dysuria -     Urinalysis, Routine w reflex microscopic -     Urine Culture  Diverticulitis Assessment & Plan: Reviewed [facility] course.  Medications reconciled.  Patient is advancing diet as tolerated.  Experiencing episodic urinary symptoms, pending urinalysis, urine culture.  He has follow-up scheduled with gastroenterology tomorrow to discuss elective sigmoid colectomy.  Will follow.      Return precautions given.   Risks, benefits, and alternatives of the medications and treatment plan prescribed today were discussed, and patient expressed understanding.   Education regarding symptom management and diagnosis given to patient on AVS either electronically or printed.  Return in about 3 months (around 01/25/2023).  Rennie Plowman, FNP  Subjective:    Patient ID: Bernard Blake, male    DOB: [birth year], 52 y.o.   MRN: [id number]  CC: Bernard Blake is a 52 y.o. male who presents today for HFU  follow up.   HPI: Stools are becoming 'more formed'.   Abdominal pain has resolved.   He feels a 'pressure' in abdomen. He has noticed pain with urination while in the hospial and since discharged, it has been occasional. No pain with BM.      Afebrile  Appetite has improved.  He is scheduled to see [facility] 10/26/22   Presented [facility] ED for abdominal pain ; admitted 10/16/2022 and discharged 10/20/2022  Dx diverticulitis , complicated by  Ct a/p which indicated small contained perforation in the middle lower abdomen. No significant free fluid, no phlegmon or fluid collection.  Hospital course provided morphine for pain and given zosyn.  Hemodynamically stable throughout admission. NPO for 4 days.  Discharged on augmentin for 10 days, advance diet as tolerated.   scheduled follow-up to discuss elective sigmoid colectomy   H/o preDM  H/o diverticulitis 2022, initial course.    Allergies: Tetanus toxoids Current Outpatient Medications on File Prior to Visit  Medication Sig Dispense Refill   Ascorbic Acid (VITAMIN C) 1000 MG tablet Take 2,000 mg by mouth daily.     Berberine Chloride (BERBERINE HCI PO) Take 1 tablet by mouth daily.     Cetirizine HCl (ZYRTEC PO) Take by mouth.     cholecalciferol (VITAMIN D) 1000 UNITS tablet Take 2,000 Units by mouth daily.     citalopram (CELEXA) 20 MG tablet Take 1 tablet (20 mg total) by mouth daily. 90 tablet 3   Co-Enzyme Q-10 100 MG CAPS Take 100 mg by mouth.     JATENZO 237 MG CAPS Take 1 capsule (237 mg total) by mouth 2 (two) times daily. 60 capsule 3   Loratadine (CLARITIN PO) Take by mouth.     meloxicam (MOBIC) 7.5 MG tablet TAKE 1 TABLET(7.5 MG) BY MOUTH DAILY AS NEEDED FOR PAIN 30 tablet 2   Menaquinone-7 (VITAMIN K2 PO) Take by mouth.     Omega-3 Fatty Acids (FISH OIL) 1000 MG CAPS Take 2,400 mg by mouth once.     Zinc Sulfate 66 MG TABS Take 15 mg by mouth.     No current facility-administered medications on file prior to visit.    Review of Systems  Constitutional:  Negative for chills and fever.  Respiratory:  Negative for cough.   Cardiovascular:  Negative for chest pain and palpitations.  Gastrointestinal:  Negative for nausea and vomiting.      Objective:    BP 122/84   Pulse 61  Ht 6\' 2"  (1.88 m)   Wt 251 lb 9.6 oz (114.1 kg)   SpO2 96%   BMI 32.30 kg/m  BP Readings from Last 3 Encounters:  10/25/22 122/84  06/23/22 (!) 144/87  05/19/22 128/84   Wt Readings from Last 3 Encounters:  10/25/22 251 lb 9.6 oz (114.1 kg)  06/23/22 255 lb (115.7 kg)  05/19/22 258 lb 3.2 oz (117.1 kg)    Physical Exam Vitals reviewed.  Constitutional:      Appearance: He is well-developed.  Cardiovascular:     Rate and Rhythm: Regular rhythm.     Heart sounds: Normal heart sounds.  Pulmonary:     Effort: Pulmonary effort is normal. No respiratory distress.     Breath sounds: Normal breath sounds. No  wheezing, rhonchi or rales.  Abdominal:     Comments: No suprapubic tenderness.   Skin:    General: Skin is warm and dry.  Neurological:     Mental Status: He is alert.  Psychiatric:        Speech: Speech normal.        Behavior: Behavior normal.

## 2022-10-25 NOTE — Patient Instructions (Signed)
Nice to see you today  Please let me know how your appointment tomorrow goes

## 2022-10-26 DIAGNOSIS — K5720 Diverticulitis of large intestine with perforation and abscess without bleeding: Secondary | ICD-10-CM | POA: Diagnosis not present

## 2022-11-04 ENCOUNTER — Ambulatory Visit: Payer: Federal, State, Local not specified - PPO

## 2022-11-04 NOTE — Progress Notes (Signed)
Patient was present for re-scan for lost refurb orthotics will need to reimburse patient as well as he paid for refurb and his orthotics were lost  Items ordered for him today will be fit in [place] when in

## 2022-11-17 ENCOUNTER — Ambulatory Visit: Payer: Federal, State, Local not specified - PPO | Admitting: Family

## 2022-11-26 ENCOUNTER — Other Ambulatory Visit: Payer: Federal, State, Local not specified - PPO

## 2022-11-26 ENCOUNTER — Other Ambulatory Visit: Payer: Self-pay | Admitting: Family

## 2022-11-26 DIAGNOSIS — I10 Essential (primary) hypertension: Secondary | ICD-10-CM

## 2022-11-27 ENCOUNTER — Other Ambulatory Visit: Payer: Self-pay | Admitting: Family

## 2022-11-27 DIAGNOSIS — M25552 Pain in left hip: Secondary | ICD-10-CM

## 2022-11-29 NOTE — Telephone Encounter (Signed)
Spoke to pt and he did start back taking the Lotensin about 3 weeks ago because he stopped the bet juice he was taking so he would like a refill on the Lotensin.

## 2022-11-30 ENCOUNTER — Telehealth: Payer: Self-pay

## 2022-11-30 NOTE — Telephone Encounter (Signed)
LVM to inform pt that rx was sent in and he needs to call and schedule appt in a couple weeks per Claris Che

## 2022-12-03 ENCOUNTER — Telehealth: Payer: Self-pay

## 2022-12-03 NOTE — Telephone Encounter (Signed)
Patient left voice message requesting to schedule his colonoscopy.  Chart reviewed prior to calling.  Colonoscopy referral in Epic from 05/19/22. Patient had diverticulitis 10/16/22.  General surgeon chart note dated 10/26/22 recommended have colonoscopy in 6-8 weeks. Last colonoscopy was 01/18/20 with Dr.Tahiliani 4mm sigmoid polyp noted. She recommended on 01/22/20 to repeat colonoscopy in 1 year with a 2 day prep.  Voice message left for patient to call office to schedule.  Thanks,  [place], [place]

## 2022-12-07 ENCOUNTER — Telehealth: Payer: Self-pay

## 2022-12-07 NOTE — Telephone Encounter (Signed)
Pt left message to schedule colonoscopy ?

## 2022-12-08 NOTE — Telephone Encounter (Signed)
Patient called back to schedule his colonoscopy.

## 2022-12-10 ENCOUNTER — Other Ambulatory Visit: Payer: Federal, State, Local not specified - PPO

## 2022-12-10 NOTE — Telephone Encounter (Signed)
Message left for patient to return my call.  

## 2022-12-13 ENCOUNTER — Telehealth: Payer: Self-pay | Admitting: *Deleted

## 2022-12-13 ENCOUNTER — Other Ambulatory Visit: Payer: Self-pay | Admitting: *Deleted

## 2022-12-13 DIAGNOSIS — Z86010 Personal history of colonic polyps: Secondary | ICD-10-CM

## 2022-12-13 MED ORDER — NA SULFATE-K SULFATE-MG SULF 17.5-3.13-1.6 GM/177ML PO SOLN: 1.0000 | Freq: Once | ORAL | 0 refills | Status: DC

## 2022-12-13 MED ORDER — NA SULFATE-K SULFATE-MG SULF 17.5-3.13-1.6 GM/177ML PO SOLN: 1.0000 | Freq: Once | ORAL | 0 refills | Status: AC

## 2022-12-13 NOTE — Addendum Note (Signed)
Addended by: Tawnya Crook on: 12/13/2022 10:26 AM   Modules accepted: Orders

## 2022-12-13 NOTE — Telephone Encounter (Signed)
Colonoscopy schedule on 02/11/2023 with Dr Allegra Lai at [facility]

## 2022-12-13 NOTE — Telephone Encounter (Signed)
Gastroenterology Pre-Procedure Review  Request Date: 02/11/2023 Requesting Physician: Dr. Allegra Lai  PATIENT REVIEW QUESTIONS: The patient responded to the following health history questions as indicated:    1. Are you having any GI issues? no 2. Do you have a personal history of Polyps? yes (01/18/2023 with Dr Maximino Greenland) 3. Do you have a family history of Colon Cancer or Polyps? no 4. Diabetes Mellitus? no 5. Joint replacements in the past 12 months?no 6. Major health problems in the past 3 months?no 7. Any artificial heart valves, MVP, or defibrillator?no    MEDICATIONS & ALLERGIES:    Patient reports the following regarding taking any anticoagulation/antiplatelet therapy:   Plavix, Coumadin, Eliquis, Xarelto, Lovenox, Pradaxa, Brilinta, or Effient? no Aspirin? no  Patient confirms/reports the following medications:  Current Outpatient Medications  Medication Sig Dispense Refill   Na Sulfate-K Sulfate-Mg Sulf 17.5-3.13-1.6 GM/177ML SOLN Take 1 kit by mouth once for 1 dose. 354 mL 0   Ascorbic Acid (VITAMIN C) 1000 MG tablet Take 2,000 mg by mouth daily.     benazepril (LOTENSIN) 10 MG tablet TAKE 1 TABLET(10 MG) BY MOUTH DAILY 90 tablet 2   Berberine Chloride (BERBERINE HCI PO) Take 1 tablet by mouth daily.     Cetirizine HCl (ZYRTEC PO) Take by mouth.     cholecalciferol (VITAMIN D) 1000 UNITS tablet Take 2,000 Units by mouth daily.     citalopram (CELEXA) 20 MG tablet Take 1 tablet (20 mg total) by mouth daily. 90 tablet 3   Co-Enzyme Q-10 100 MG CAPS Take 100 mg by mouth.     JATENZO 237 MG CAPS Take 1 capsule (237 mg total) by mouth 2 (two) times daily. 60 capsule 3   Loratadine (CLARITIN PO) Take by mouth.     meloxicam (MOBIC) 7.5 MG tablet TAKE 1 TABLET(7.5 MG) BY MOUTH DAILY AS NEEDED FOR PAIN 30 tablet 2   Menaquinone-7 (VITAMIN K2 PO) Take by mouth.     Omega-3 Fatty Acids (FISH OIL) 1000 MG CAPS Take 2,400 mg by mouth once.     Zinc Sulfate 66 MG TABS Take 15 mg by mouth.      No current facility-administered medications for this visit.    Patient confirms/reports the following allergies:  Allergies  Allergen Reactions   Tetanus Toxoids Swelling    No orders of the defined types were placed in this encounter.   AUTHORIZATION INFORMATION Primary Insurance: 1D#: Group #:  Secondary Insurance: 1D#: Group #:  SCHEDULE INFORMATION: Date: 02/11/2023 Time: Location: [facility]

## 2022-12-13 NOTE — Telephone Encounter (Signed)
From what patient remember, he was not able to finish the last prep solution due to the amount of prep he had to drink. Will send Suprep with attached GoodRx card

## 2022-12-23 ENCOUNTER — Other Ambulatory Visit: Payer: Federal, State, Local not specified - PPO

## 2022-12-23 DIAGNOSIS — E291 Testicular hypofunction: Secondary | ICD-10-CM

## 2022-12-24 ENCOUNTER — Other Ambulatory Visit: Payer: Federal, State, Local not specified - PPO

## 2022-12-24 LAB — TESTOSTERONE: Testosterone: 311 ng/dL (ref 264–916)

## 2022-12-24 LAB — HEMATOCRIT: Hematocrit: 49.2 % (ref 37.5–51.0)

## 2022-12-24 NOTE — Progress Notes (Signed)
Patient cancelled appt 9/20  in [place] I took orthotics back to [place] with me thinking he'd come there as he was scanned there however forgot to bring back to [place] today for appt. Called and Mailbox is full I sent text saying I can ship to him Monday when I get back in office  [place] Bailey Cped, CFo, CFm

## 2022-12-27 ENCOUNTER — Other Ambulatory Visit: Payer: Self-pay | Admitting: Urology

## 2022-12-27 NOTE — Progress Notes (Signed)
Mailed to patient today tracking number  [phone] USPS  Bernard Blake Cped, CFo, CFm

## 2023-01-19 DIAGNOSIS — D225 Melanocytic nevi of trunk: Secondary | ICD-10-CM | POA: Diagnosis not present

## 2023-01-19 DIAGNOSIS — D2262 Melanocytic nevi of left upper limb, including shoulder: Secondary | ICD-10-CM | POA: Diagnosis not present

## 2023-01-19 DIAGNOSIS — D2261 Melanocytic nevi of right upper limb, including shoulder: Secondary | ICD-10-CM | POA: Diagnosis not present

## 2023-01-19 DIAGNOSIS — D2272 Melanocytic nevi of left lower limb, including hip: Secondary | ICD-10-CM | POA: Diagnosis not present

## 2023-01-27 ENCOUNTER — Encounter: Payer: Self-pay | Admitting: Family

## 2023-01-27 ENCOUNTER — Telehealth: Payer: Self-pay

## 2023-01-27 ENCOUNTER — Ambulatory Visit: Payer: Federal, State, Local not specified - PPO | Admitting: Family

## 2023-01-27 VITALS — BP 126/78 | HR 76 | Temp 97.7°F | Ht 74.5 in | Wt 248.2 lb

## 2023-01-27 DIAGNOSIS — R7309 Other abnormal glucose: Secondary | ICD-10-CM | POA: Diagnosis not present

## 2023-01-27 DIAGNOSIS — I10 Essential (primary) hypertension: Secondary | ICD-10-CM | POA: Diagnosis not present

## 2023-01-27 LAB — POCT GLYCOSYLATED HEMOGLOBIN (HGB A1C): Hemoglobin A1C: 5.7 % — AB (ref 4.0–5.6)

## 2023-01-27 NOTE — Assessment & Plan Note (Signed)
Chronic, stable.  Continue Lotensin 10 mg

## 2023-01-27 NOTE — Progress Notes (Signed)
Assessment & Plan:  Elevated glucose -     POCT glycosylated hemoglobin (Hb A1C)  Essential hypertension Assessment & Plan: Chronic , stable. Continue Lotensin 10 mg      Return precautions given.   Risks, benefits, and alternatives of the medications and treatment plan prescribed today were discussed, and patient expressed understanding.   Education regarding symptom management and diagnosis given to patient on AVS either electronically or printed.  No follow-ups on file.  Rennie Plowman, FNP  Subjective:    Patient ID: Bernard Blake, Bernard Blake    DOB: 06/20/1970, 52 y.o.   MRN: [id number]  CC: Bernard Blake is a 52 y.o. Bernard Blake who presents today for follow up.   HPI: Feels well today. no new complaints.  Compliant with benazepril 10mg .   Colonoscopy is scheduled   Allergies: Tetanus toxoids Current Outpatient Medications on File Prior to Visit  Medication Sig Dispense Refill   Ascorbic Acid (VITAMIN C) 1000 MG tablet Take 2,000 mg by mouth daily.     benazepril (LOTENSIN) 10 MG tablet TAKE 1 TABLET(10 MG) BY MOUTH DAILY 90 tablet 2   Berberine Chloride (BERBERINE HCI PO) Take 1 tablet by mouth daily.     Cetirizine HCl (ZYRTEC PO) Take by mouth.     cholecalciferol (VITAMIN D) 1000 UNITS tablet Take 2,000 Units by mouth daily.     citalopram (CELEXA) 20 MG tablet Take 1 tablet (20 mg total) by mouth daily. 90 tablet 3   Co-Enzyme Q-10 100 MG CAPS Take 100 mg by mouth.     JATENZO 237 MG CAPS TAKE ONE CAPSULE BY MOUTH TWICE DAILY 60 capsule 3   Loratadine (CLARITIN PO) Take by mouth.     meloxicam (MOBIC) 7.5 MG tablet TAKE 1 TABLET(7.5 MG) BY MOUTH DAILY AS NEEDED FOR PAIN 30 tablet 2   Menaquinone-7 (VITAMIN K2 PO) Take by mouth.     Omega-3 Fatty Acids (FISH OIL) 1000 MG CAPS Take 2,400 mg by mouth once.     Turmeric (QC TUMERIC COMPLEX PO) Take by mouth. Takes as directed     Zinc Sulfate 66 MG TABS Take 15 mg by mouth.     No current facility-administered  medications on file prior to visit.    Review of Systems  Constitutional:  Negative for chills and fever.  Respiratory:  Negative for cough.   Cardiovascular:  Negative for chest pain and palpitations.  Gastrointestinal:  Negative for nausea and vomiting.      Objective:    BP 126/78   Pulse 76   Temp 97.7 F (36.5 C) (Oral)   Ht 6' 2.5" (1.892 m)   Wt 248 lb 3.2 oz (112.6 kg)   SpO2 96%   BMI 31.44 kg/m  BP Readings from Last 3 Encounters:  01/27/23 126/78  10/25/22 122/84  06/23/22 (!) 144/87   Wt Readings from Last 3 Encounters:  01/27/23 248 lb 3.2 oz (112.6 kg)  10/25/22 251 lb 9.6 oz (114.1 kg)  06/23/22 255 lb (115.7 kg)    Physical Exam Vitals reviewed.  Constitutional:      Appearance: He is well-developed.  Cardiovascular:     Rate and Rhythm: Regular rhythm.     Heart sounds: Normal heart sounds.  Pulmonary:     Effort: Pulmonary effort is normal. No respiratory distress.     Breath sounds: Normal breath sounds. No wheezing, rhonchi or rales.  Skin:    General: Skin is warm and dry.  Neurological:     Mental  Status: He is alert.  Psychiatric:        Speech: Speech normal.        Behavior: Behavior normal.

## 2023-01-27 NOTE — Telephone Encounter (Signed)
Patient states at check-out that he would like to verify that he does not need to schedule a follow-up appointment with Rennie Plowman, FNP.  I had already spoken with Jenate [place], CMA, regarding possible appointment for lab.  I tried to reach [place] again and she was unavailable.  Patient states we may call and let him know.

## 2023-01-28 NOTE — Telephone Encounter (Signed)
Sent pt my chart message to let him know that Bernard Blake did not  want him to schedule a f/up visit, but if he needs to before his annual he may do so

## 2023-02-04 ENCOUNTER — Ambulatory Visit: Payer: Federal, State, Local not specified - PPO

## 2023-02-04 ENCOUNTER — Encounter: Payer: Self-pay | Admitting: Gastroenterology

## 2023-02-04 NOTE — Progress Notes (Signed)
Adj patient is happy with product just would like neoprene material  [place] Bailey Cped, CFo, CFm

## 2023-02-11 ENCOUNTER — Other Ambulatory Visit: Payer: Federal, State, Local not specified - PPO

## 2023-02-11 ENCOUNTER — Other Ambulatory Visit: Payer: Self-pay

## 2023-02-11 ENCOUNTER — Encounter
Admission: RE | Disposition: A | Discharge status: Home / Self Care | Payer: Self-pay | Source: Home / Self Care | Attending: Gastroenterology

## 2023-02-11 ENCOUNTER — Encounter: Payer: Self-pay | Admitting: Gastroenterology

## 2023-02-11 ENCOUNTER — Ambulatory Visit
Admission: RE | Admit: 2023-02-11 | Discharge: 2023-02-11 | Disposition: A | Discharge status: Home / Self Care | Payer: Federal, State, Local not specified - PPO | Attending: Gastroenterology | Admitting: Gastroenterology

## 2023-02-11 ENCOUNTER — Ambulatory Visit: Payer: Federal, State, Local not specified - PPO | Admitting: Anesthesiology

## 2023-02-11 DIAGNOSIS — Z1211 Encounter for screening for malignant neoplasm of colon: Secondary | ICD-10-CM | POA: Insufficient documentation

## 2023-02-11 DIAGNOSIS — I10 Essential (primary) hypertension: Secondary | ICD-10-CM | POA: Diagnosis not present

## 2023-02-11 DIAGNOSIS — D128 Benign neoplasm of rectum: Secondary | ICD-10-CM | POA: Diagnosis not present

## 2023-02-11 DIAGNOSIS — E669 Obesity, unspecified: Secondary | ICD-10-CM | POA: Diagnosis not present

## 2023-02-11 DIAGNOSIS — K635 Polyp of colon: Secondary | ICD-10-CM

## 2023-02-11 DIAGNOSIS — Z6830 Body mass index (BMI) 30.0-30.9, adult: Secondary | ICD-10-CM | POA: Insufficient documentation

## 2023-02-11 DIAGNOSIS — K644 Residual hemorrhoidal skin tags: Secondary | ICD-10-CM | POA: Diagnosis not present

## 2023-02-11 DIAGNOSIS — Z79899 Other long term (current) drug therapy: Secondary | ICD-10-CM | POA: Insufficient documentation

## 2023-02-11 DIAGNOSIS — K5730 Diverticulosis of large intestine without perforation or abscess without bleeding: Secondary | ICD-10-CM | POA: Insufficient documentation

## 2023-02-11 DIAGNOSIS — Z860100 Personal history of colon polyps, unspecified: Secondary | ICD-10-CM

## 2023-02-11 DIAGNOSIS — D122 Benign neoplasm of ascending colon: Secondary | ICD-10-CM | POA: Insufficient documentation

## 2023-02-11 DIAGNOSIS — Z87891 Personal history of nicotine dependence: Secondary | ICD-10-CM | POA: Insufficient documentation

## 2023-02-11 DIAGNOSIS — Z860101 Personal history of adenomatous and serrated colon polyps: Secondary | ICD-10-CM | POA: Insufficient documentation

## 2023-02-11 DIAGNOSIS — K621 Rectal polyp: Secondary | ICD-10-CM

## 2023-02-11 HISTORY — PX: POLYPECTOMY: SHX5525

## 2023-02-11 HISTORY — PX: COLONOSCOPY WITH PROPOFOL: SHX5780

## 2023-02-11 SURGERY — COLONOSCOPY WITH PROPOFOL
Anesthesia: General

## 2023-02-11 MED ORDER — PROPOFOL 500 MG/50ML IV EMUL
INTRAVENOUS | Status: DC | PRN
  Administered 2023-02-11: 175 ug/kg/min via INTRAVENOUS

## 2023-02-11 MED ORDER — PROPOFOL 10 MG/ML IV BOLUS
INTRAVENOUS | Status: DC | PRN
  Administered 2023-02-11: 100 mg via INTRAVENOUS

## 2023-02-11 MED ORDER — DEXMEDETOMIDINE HCL IN NACL 80 MCG/20ML IV SOLN
INTRAVENOUS | Status: AC
Start: 2023-02-11 — End: ?
  Filled 2023-02-11: qty 20

## 2023-02-11 MED ORDER — LIDOCAINE HCL (CARDIAC) PF 100 MG/5ML IV SOSY
PREFILLED_SYRINGE | INTRAVENOUS | Status: DC | PRN
  Administered 2023-02-11: 60 mg via INTRAVENOUS
  Administered 2023-02-11: 40 mg via INTRAVENOUS

## 2023-02-11 MED ORDER — LIDOCAINE HCL (PF) 2 % IJ SOLN
INTRAMUSCULAR | Status: AC
  Filled 2023-02-11: qty 5

## 2023-02-11 MED ORDER — PROPOFOL 1000 MG/100ML IV EMUL
INTRAVENOUS | Status: AC
  Filled 2023-02-11: qty 100

## 2023-02-11 MED ORDER — SODIUM CHLORIDE 0.9 % IV SOLN
INTRAVENOUS | Status: DC
Start: 2023-02-11 — End: 2023-02-11

## 2023-02-11 MED ORDER — DEXMEDETOMIDINE HCL IN NACL 80 MCG/20ML IV SOLN
INTRAVENOUS | Status: DC | PRN
Start: 2023-02-11 — End: 2023-02-11
  Administered 2023-02-11: 8 ug via INTRAVENOUS

## 2023-02-11 NOTE — Anesthesia Procedure Notes (Signed)
Date/Time: 02/11/2023 8:03 AM  Performed by: Stormy Fabian, CRNAPre-anesthesia Checklist: Patient identified, Emergency Drugs available, Suction available and Patient being monitored Patient Re-evaluated:Patient Re-evaluated prior to induction Oxygen Delivery Method: Nasal cannula Induction Type: IV induction Dental Injury: Teeth and Oropharynx as per pre-operative assessment  Comments: Nasal cannula with etCO2 monitoring

## 2023-02-11 NOTE — H&P (Signed)
Arlyss Repress, MD [address]  [address]  [place], [place] [address]  Main: [phone]  Fax: [phone] Pager: [phone]  Primary Care Physician:  Allegra Grana, FNP Primary Gastroenterologist:  Dr. Arlyss Repress  Pre-Procedure History & Physical: HPI:  Bernard Blake is a 52 y.o. male is here for an colonoscopy.   Past Medical History:  Diagnosis Date   Allergy    Chicken pox    Depression    Diverticulitis    07/11/20   History of alcohol abuse    Hypertension    Low testosterone     Past Surgical History:  Procedure Laterality Date   ANKLE SURGERY Left    BB removal   COLONOSCOPY WITH PROPOFOL N/A 01/18/2020   Procedure: COLONOSCOPY WITH PROPOFOL;  Surgeon: Pasty Spillers, MD;  Location: [facility] ENDOSCOPY;  Service: Endoscopy;  Laterality: N/A;    Prior to Admission medications   Medication Sig Start Date End Date Taking? Authorizing Provider  Ascorbic Acid (VITAMIN C) 1000 MG tablet Take 2,000 mg by mouth daily.   Yes [provider]  benazepril (LOTENSIN) 10 MG tablet TAKE 1 TABLET(10 MG) BY MOUTH DAILY 11/30/22  Yes Arnett, Lyn Records, FNP  Berberine Chloride (BERBERINE HCI PO) Take 1 tablet by mouth daily.   Yes [provider]  Cetirizine HCl (ZYRTEC PO) Take by mouth.   Yes [provider]  cholecalciferol (VITAMIN D) 1000 UNITS tablet Take 2,000 Units by mouth daily.   Yes [provider]  citalopram (CELEXA) 20 MG tablet Take 1 tablet (20 mg total) by mouth daily. 01/29/22  Yes Allegra Grana, FNP  Co-Enzyme Q-10 100 MG CAPS Take 100 mg by mouth.   Yes [provider]  JATENZO 237 MG CAPS TAKE ONE CAPSULE BY MOUTH TWICE DAILY 12/28/22  Yes Stoioff, Verna Czech, MD  Loratadine (CLARITIN PO) Take by mouth.   Yes [provider]  magnesium 30 MG tablet Take 30 mg by mouth 2 (two) times daily.   Yes [provider]  meloxicam (MOBIC) 7.5 MG tablet TAKE 1 TABLET(7.5 MG) BY MOUTH  DAILY AS NEEDED FOR PAIN 11/29/22  Yes Arnett, Lyn Records, FNP  Menaquinone-7 (VITAMIN K2 PO) Take by mouth.   Yes [provider]  Omega-3 Fatty Acids (FISH OIL) 1000 MG CAPS Take 2,400 mg by mouth once.   Yes [provider]  Turmeric (QC TUMERIC COMPLEX PO) Take by mouth. Takes as directed   Yes [provider]  Zinc Sulfate 66 MG TABS Take 15 mg by mouth.   Yes [provider]    Allergies as of 12/13/2022 - Review Complete 10/25/2022  Allergen Reaction Noted   Tetanus toxoids Swelling 03/12/2015    Family History  Problem Relation Age of Onset   Arthritis Mother    Hyperlipidemia Mother    Hypertension Mother    Arthritis Father    Hyperlipidemia Father    Hypertension Father    Diabetes Father     Social History   Socioeconomic History   Marital status: Married    Spouse name: Not on file   Number of children: Not on file   Years of education: Not on file   Highest education level: Associate degree: occupational, Scientist, product/process development, or vocational program  Occupational History   Not on file  Tobacco Use   Smoking status: Former    Current packs/day: 1.00    Average packs/day: 1 pack/day for 8.0 years (8.0 ttl pk-yrs)  Types: Cigarettes   Smokeless tobacco: Current    Types: Snuff   Tobacco comments:    Quit smoking in 1995.  Vaping Use   Vaping status: Never Used  Substance and Sexual Activity   Alcohol use: No    Alcohol/week: 0.0 standard drinks of alcohol    Comment: Former alcoholic; now sober   Drug use: No   Sexual activity: Yes  Other Topics Concern   Not on file  Social History Narrative   Works [employer]   Social Determinants of Health   Financial Resource Strain: Low Risk  (10/24/2022)   Overall Financial Resource Strain (CARDIA)    Difficulty of Paying Living Expenses: Not very hard  Recent Concern: Financial Resource Strain - Medium Risk (10/18/2022)   Received from [facility]   Overall Financial Resource Strain  (CARDIA)    Difficulty of Paying Living Expenses: Somewhat hard  Food Insecurity: No Food Insecurity (10/24/2022)   Hunger Vital Sign    Worried About Running Out of Food in the Last Year: Never true    Ran Out of Food in the Last Year: Never true  Transportation Needs: No Transportation Needs (10/24/2022)   PRAPARE - Administrator, Civil Service (Medical): No    Lack of Transportation (Non-Medical): No  Physical Activity: Sufficiently Active (10/24/2022)   Exercise Vital Sign    Days of Exercise per Week: 5 days    Minutes of Exercise per Session: 60 min  Stress: No Stress Concern Present (10/24/2022)   [employer] of Occupational Health - Occupational Stress Questionnaire    Feeling of Stress : Only a little  Social Connections: Moderately Integrated (10/24/2022)   Social Connection and Isolation Panel [NHANES]    Frequency of Communication with Friends and Family: Three times a week    Frequency of Social Gatherings with Friends and Family: Twice a week    Attends Religious Services: Never    Database administrator or Organizations: Yes    Attends Engineer, structural: More than 4 times per year    Marital Status: Married  Catering manager Violence: Not on file    Review of Systems: See HPI, otherwise negative ROS  Physical Exam: BP 133/89   Pulse 80   Temp (!) 97.4 F (36.3 C) (Temporal)   Wt 110.7 kg   SpO2 99%   BMI 30.91 kg/m  General:   Alert,  pleasant and cooperative in NAD Head:  Normocephalic and atraumatic. Neck:  Supple; no masses or thyromegaly. Lungs:  Clear throughout to auscultation.    Heart:  Regular rate and rhythm. Abdomen:  Soft, nontender and nondistended. Normal bowel sounds, without guarding, and without rebound.   Neurologic:  Alert and  oriented x4;  grossly normal neurologically.  Impression/Plan: Bernard Blake is here for an colonoscopy to be performed for h/o colon adenoma  Risks, benefits, limitations, and  alternatives regarding  colonoscopy have been reviewed with the patient.  Questions have been answered.  All parties agreeable.   Lannette Donath, MD  02/11/2023, 7:47 AM

## 2023-02-11 NOTE — Transfer of Care (Signed)
Immediate Anesthesia Transfer of Care Note  Patient: Bernard Blake  Procedure(s) Performed: Procedure(s): COLONOSCOPY WITH PROPOFOL (N/A) POLYPECTOMY  Patient Location: PACU and Endoscopy Unit  Anesthesia Type:General  Level of Consciousness: sedated  Airway & Oxygen Therapy: Patient Spontanous Breathing and Patient connected to nasal cannula oxygen  Post-op Assessment: Report given to RN and Post -op Vital signs reviewed and stable  Post vital signs: Reviewed and stable  Last Vitals:  Vitals:   02/11/23 0712 02/11/23 0817  BP: 133/89 94/62  Pulse: 80 75  Resp:  15  Temp: (!) 36.3 C   SpO2: 99% 95%    Complications: No apparent anesthesia complications

## 2023-02-11 NOTE — Anesthesia Preprocedure Evaluation (Signed)
Anesthesia Evaluation  Patient identified by MRN, date of birth, ID band Patient awake    Reviewed: Allergy & Precautions, NPO status , Patient's Chart, lab work & pertinent test results  Airway Mallampati: II  TM Distance: >3 FB Neck ROM: Full    Dental  (+) Teeth Intact   Pulmonary neg pulmonary ROS, Patient abstained from smoking., former smoker   Pulmonary exam normal breath sounds clear to auscultation       Cardiovascular Exercise Tolerance: Good hypertension, negative cardio ROS Normal cardiovascular exam Rhythm:Regular Rate:Normal     Neuro/Psych negative neurological ROS  negative psych ROS   GI/Hepatic negative GI ROS, Neg liver ROS,,,  Endo/Other  negative endocrine ROS    Renal/GU negative Renal ROS  negative genitourinary   Musculoskeletal   Abdominal  (+) + obese  Peds negative pediatric ROS (+)  Hematology negative hematology ROS (+)   Anesthesia Other Findings Past Medical History: No date: Allergy No date: Chicken pox No date: Depression No date: Diverticulitis     Comment:  07/11/20 No date: History of alcohol abuse No date: Hypertension No date: Low testosterone  Past Surgical History: No date: ANKLE SURGERY; Left     Comment:  BB removal 01/18/2020: COLONOSCOPY WITH PROPOFOL; N/A     Comment:  Procedure: COLONOSCOPY WITH PROPOFOL;  Surgeon:               Pasty Spillers, MD;  Location: [facility] ENDOSCOPY;                Service: Endoscopy;  Laterality: N/A;  BMI    Body Mass Index: 30.91 kg/m      Reproductive/Obstetrics negative OB ROS                             Anesthesia Physical Anesthesia Plan  ASA: 2  Anesthesia Plan: General   Post-op Pain Management:    Induction: Intravenous  PONV Risk Score and Plan: Propofol infusion and TIVA  Airway Management Planned: Natural Airway and Nasal Cannula  Additional Equipment:   Intra-op Plan:    Post-operative Plan:   Informed Consent: I have reviewed the patients History and Physical, chart, labs and discussed the procedure including the risks, benefits and alternatives for the proposed anesthesia with the patient or authorized representative who has indicated his/her understanding and acceptance.     Dental Advisory Given  Plan Discussed with: CRNA and Surgeon  Anesthesia Plan Comments:        Anesthesia Quick Evaluation

## 2023-02-11 NOTE — Anesthesia Postprocedure Evaluation (Signed)
Anesthesia Post Note  Patient: Bernard Blake  Procedure(s) Performed: COLONOSCOPY WITH PROPOFOL POLYPECTOMY  Patient location during evaluation: PACU Anesthesia Type: General Level of consciousness: awake and awake and alert Pain management: satisfactory to patient Vital Signs Assessment: post-procedure vital signs reviewed and stable Respiratory status: spontaneous breathing Cardiovascular status: stable Anesthetic complications: no   No notable events documented.   Last Vitals:  Vitals:   02/11/23 0827 02/11/23 0837  BP: 103/70 122/83  Pulse: 66 61  Resp: 19   Temp:    SpO2: 93% 97%    Last Pain:  Vitals:   02/11/23 0837  TempSrc:   PainSc: 0-No pain                 VAN STAVEREN,Setsuko Robins

## 2023-02-11 NOTE — Op Note (Signed)
[facility] Gastroenterology Patient Name: Bernard Blake Procedure Date: 02/11/2023 7:25 AM MRN: [id number] Account #: [id number] Date of Birth: July 08, 1970 Admit Type: Outpatient Age: 52 Room: [facility] ENDO ROOM 2 Gender: Male Note Status: Finalized Instrument Name: Prentice Docker [id number] Procedure:             Colonoscopy Indications:           Surveillance: Personal history of adenomatous polyps                         on last colonoscopy 3 years ago, Last colonoscopy:                         October 2021 Providers:             Toney Reil MD, MD Referring MD:          Lyn Records. Arnett (Referring MD) Medicines:             General Anesthesia Complications:         No immediate complications. Estimated blood loss: None. Procedure:             Pre-Anesthesia Assessment:                        - Prior to the procedure, a History and Physical was                         performed, and patient medications and allergies were                         reviewed. The patient is competent. The risks and                         benefits of the procedure and the sedation options and                         risks were discussed with the patient. All questions                         were answered and informed consent was obtained.                         Patient identification and proposed procedure were                         verified by the physician, the nurse, the                         anesthesiologist, the anesthetist and the technician                         in the pre-procedure area in the procedure room in the                         endoscopy suite. Mental Status Examination: alert and                         oriented. Airway Examination: normal oropharyngeal  airway and neck mobility. Respiratory Examination:                         clear to auscultation. CV Examination: normal.                         Prophylactic Antibiotics: The  patient does not require                         prophylactic antibiotics. Prior Anticoagulants: The                         patient has taken no anticoagulant or antiplatelet                         agents. ASA Grade Assessment: II - A patient with mild                         systemic disease. After reviewing the risks and                         benefits, the patient was deemed in satisfactory                         condition to undergo the procedure. The anesthesia                         plan was to use general anesthesia. Immediately prior                         to administration of medications, the patient was                         re-assessed for adequacy to receive sedatives. The                         heart rate, respiratory rate, oxygen saturations,                         blood pressure, adequacy of pulmonary ventilation, and                         response to care were monitored throughout the                         procedure. The physical status of the patient was                         re-assessed after the procedure.                        After obtaining informed consent, the colonoscope was                         passed under direct vision. Throughout the procedure,                         the patient's blood pressure, pulse, and oxygen  saturations were monitored continuously. The                         Colonoscope was introduced through the anus and                         advanced to the the cecum, identified by appendiceal                         orifice and ileocecal valve. The colonoscopy was                         performed without difficulty. The patient tolerated                         the procedure well. The quality of the bowel                         preparation was evaluated using the BBPS [facility] Bowel                         Preparation Scale) with scores of: Right Colon = 3,                         Transverse Colon = 3 and  Left Colon = 3 (entire mucosa                         seen well with no residual staining, small fragments                         of stool or opaque liquid). The total BBPS score                         equals 9. The ileocecal valve, appendiceal orifice,                         and rectum were photographed. Findings:      The perianal and digital rectal examinations were normal. Pertinent       negatives include normal sphincter tone and no palpable rectal lesions.      Three sessile polyps were found in the rectum and ascending colon. The       polyps were 5 to 6 mm in size. These polyps were removed with a cold       snare. Resection and retrieval were complete. Estimated blood loss: none.      Multiple diverticula were found in the recto-sigmoid colon and sigmoid       colon.      Non-bleeding external hemorrhoids were found during retroflexion. The       hemorrhoids were medium-sized. Impression:            - Three 5 to 6 mm polyps in the rectum and in the                         ascending colon, removed with a cold snare. Resected                         and retrieved.                        -  Diverticulosis in the recto-sigmoid colon and in the                         sigmoid colon.                        - Non-bleeding external hemorrhoids. Recommendation:        - Discharge patient to home (with escort).                        - Resume previous diet today.                        - Continue present medications.                        - Await pathology results.                        - Repeat colonoscopy in 3 - 5 years for surveillance                         based on pathology results. Procedure Code(s):     --- Professional ---                        [phone], Colonoscopy, flexible; with removal of                         tumor(s), polyp(s), or other lesion(s) by snare                         technique Diagnosis Code(s):     --- Professional ---                        Z86.010,  Personal history of colonic polyps                        D12.8, Benign neoplasm of rectum                        D12.2, Benign neoplasm of ascending colon                        K64.4, Residual hemorrhoidal skin tags                        K57.30, Diverticulosis of large intestine without                         perforation or abscess without bleeding CPT copyright 2022 American Medical Association. All rights reserved. The codes documented in this report are preliminary and upon coder review may  be revised to meet current compliance requirements. Dr. Libby Maw Toney Reil MD, MD 02/11/2023 8:16:06 AM This report has been signed electronically. Number of Addenda: 0 Note Initiated On: 02/11/2023 7:25 AM Scope Withdrawal Time: 0 hours 10 minutes 21 seconds  Total Procedure Duration: 0 hours 13 minutes 4 seconds  Estimated Blood Loss:  Estimated blood loss: none.      [facility]

## 2023-02-15 ENCOUNTER — Encounter: Payer: Self-pay | Admitting: Gastroenterology

## 2023-02-15 LAB — SURGICAL PATHOLOGY

## 2023-02-16 ENCOUNTER — Encounter: Payer: Self-pay | Admitting: Gastroenterology

## 2023-02-19 ENCOUNTER — Other Ambulatory Visit: Payer: Self-pay | Admitting: Family

## 2023-02-19 DIAGNOSIS — F32A Depression, unspecified: Secondary | ICD-10-CM

## 2023-02-20 ENCOUNTER — Other Ambulatory Visit: Payer: Self-pay | Admitting: Family

## 2023-02-20 DIAGNOSIS — M25552 Pain in left hip: Secondary | ICD-10-CM

## 2023-03-31 ENCOUNTER — Telehealth: Payer: Self-pay

## 2023-03-31 NOTE — Telephone Encounter (Signed)
 Taking orthos to [place] on 1/10 for his appt

## 2023-04-05 NOTE — Telephone Encounter (Signed)
 Orthotics shipped today Tracking Number [id number] USPS

## 2023-04-26 ENCOUNTER — Other Ambulatory Visit: Payer: Self-pay | Admitting: Family

## 2023-04-26 DIAGNOSIS — M25552 Pain in left hip: Secondary | ICD-10-CM

## 2023-04-29 ENCOUNTER — Other Ambulatory Visit: Payer: Federal, State, Local not specified - PPO

## 2023-04-29 DIAGNOSIS — R509 Fever, unspecified: Secondary | ICD-10-CM | POA: Diagnosis not present

## 2023-04-29 DIAGNOSIS — Z20822 Contact with and (suspected) exposure to covid-19: Secondary | ICD-10-CM | POA: Diagnosis not present

## 2023-04-29 DIAGNOSIS — J209 Acute bronchitis, unspecified: Secondary | ICD-10-CM | POA: Diagnosis not present

## 2023-05-28 ENCOUNTER — Other Ambulatory Visit: Payer: Self-pay | Admitting: Family

## 2023-05-28 DIAGNOSIS — F32A Depression, unspecified: Secondary | ICD-10-CM

## 2023-06-21 ENCOUNTER — Other Ambulatory Visit: Payer: Self-pay

## 2023-06-21 ENCOUNTER — Other Ambulatory Visit: Payer: Federal, State, Local not specified - PPO

## 2023-06-21 DIAGNOSIS — Z125 Encounter for screening for malignant neoplasm of prostate: Secondary | ICD-10-CM

## 2023-06-21 DIAGNOSIS — E291 Testicular hypofunction: Secondary | ICD-10-CM | POA: Diagnosis not present

## 2023-06-22 LAB — HEMATOCRIT: Hematocrit: 48.7 % (ref 37.5–51.0)

## 2023-06-22 LAB — TESTOSTERONE: Testosterone: 237 ng/dL — ABNORMAL LOW (ref 264–916)

## 2023-06-22 LAB — PSA: Prostate Specific Ag, Serum: 0.8 ng/mL (ref 0.0–4.0)

## 2023-06-23 ENCOUNTER — Ambulatory Visit: Payer: Federal, State, Local not specified - PPO | Admitting: Urology

## 2023-06-24 ENCOUNTER — Ambulatory Visit: Payer: Self-pay | Admitting: Urology

## 2023-06-24 ENCOUNTER — Other Ambulatory Visit: Payer: Federal, State, Local not specified - PPO

## 2023-06-24 ENCOUNTER — Encounter: Payer: Self-pay | Admitting: Urology

## 2023-06-24 VITALS — BP 149/87 | HR 74 | Ht 74.5 in | Wt 249.0 lb

## 2023-06-24 DIAGNOSIS — E291 Testicular hypofunction: Secondary | ICD-10-CM

## 2023-06-24 DIAGNOSIS — Z125 Encounter for screening for malignant neoplasm of prostate: Secondary | ICD-10-CM

## 2023-06-24 DIAGNOSIS — N401 Enlarged prostate with lower urinary tract symptoms: Secondary | ICD-10-CM | POA: Diagnosis not present

## 2023-06-24 MED ORDER — JATENZO 237 MG PO CAPS: 1.0000 | ORAL_CAPSULE | Freq: Two times a day (BID) | ORAL | 3 refills | Status: DC

## 2023-06-24 NOTE — Progress Notes (Addendum)
 I, Maysun LITTIE Griffiths, acting as a scribe for Glendia JAYSON Barba, MD., have documented all relevant documentation on the behalf of Glendia JAYSON Barba, MD, as directed by Glendia JAYSON Barba, MD while in the presence of Glendia JAYSON Barba, MD.  06/24/2023 2:14 PM   Bernard Blake 1971-01-16 [id number]  Referring provider: Dineen Rollene MATSU, FNP [address] [place],  [place] [address]  Chief Complaint  Patient presents with   Hypogonadism   Urologic history: 1. Hypogonadism Previously followed by Dr. Kassie; on TRT with Jatenzo  237 mg twice daily.  HPI: Bernard Blake is a 52 y.o. male presents for annual follow-up.   No problem since last year's visit.  He does have urinary hesitancy, decreased force and caliber of his urinary stream, but not bothersome enough that he desires treatment.  He has good energy and libido Labs 06/21/23 testosterone  237, PSA 0.8, hematocrit 48.7  PSA trend   Prostate Specific Ag, Serum  Latest Ref Rng 0.0 - 4.0 ng/mL  06/23/2022 1.1   06/21/2023 0.8       PMH: Past Medical History:  Diagnosis Date   Allergy    Chicken pox    Depression    Diverticulitis    07/11/20   History of alcohol abuse    Hypertension    Low testosterone      Surgical History: Past Surgical History:  Procedure Laterality Date   ANKLE SURGERY Left    BB removal   COLONOSCOPY WITH PROPOFOL  N/A 01/18/2020   Procedure: COLONOSCOPY WITH PROPOFOL ;  Surgeon: Janalyn Keene NOVAK, MD;  Location: [facility] ENDOSCOPY;  Service: Endoscopy;  Laterality: N/A;   COLONOSCOPY WITH PROPOFOL  N/A 02/11/2023   Procedure: COLONOSCOPY WITH PROPOFOL ;  Surgeon: Unk Corinn Skiff, MD;  Location: [facility] ENDOSCOPY;  Service: Gastroenterology;  Laterality: N/A;   POLYPECTOMY  02/11/2023   Procedure: POLYPECTOMY;  Surgeon: Unk Corinn Skiff, MD;  Location: [facility] ENDOSCOPY;  Service: Gastroenterology;;    Home Medications:  Allergies as of 06/24/2023       Reactions   Tetanus Toxoid, Adsorbed  Swelling   Tetanus Toxoids Swelling        Medication List        Accurate as of June 24, 2023  2:14 PM. If you have any questions, ask your nurse or doctor.          benazepril  10 MG tablet Commonly known as: LOTENSIN  TAKE 1 TABLET(10 MG) BY MOUTH DAILY   BERBERINE HCI PO Take 1 tablet by mouth daily.   cholecalciferol 1000 units tablet Commonly known as: VITAMIN D  Take 2,000 Units by mouth daily.   citalopram  20 MG tablet Commonly known as: CELEXA  TAKE 1 TABLET(20 MG) BY MOUTH DAILY   CLARITIN PO Take by mouth.   Co-Enzyme Q-10 100 MG Caps Take 100 mg by mouth.   Fish Oil 1000 MG Caps Take 2,400 mg by mouth once.   Jatenzo  237 MG Caps Generic drug: Testosterone  Undecanoate Take 1 capsule (237 mg total) by mouth 2 (two) times daily.   magnesium 30 MG tablet Take 30 mg by mouth 2 (two) times daily.   meloxicam  7.5 MG tablet Commonly known as: MOBIC  TAKE 1 TABLET(7.5 MG) BY MOUTH DAILY AS NEEDED FOR PAIN   QC TUMERIC COMPLEX PO Take by mouth. Takes as directed   vitamin C 1000 MG tablet Take 2,000 mg by mouth daily.   VITAMIN K2 PO Take by mouth.   Zinc Sulfate 66 MG Tabs Take 15 mg by mouth.  ZYRTEC PO Take by mouth.        Allergies:  Allergies  Allergen Reactions   Tetanus Toxoid, Adsorbed Swelling   Tetanus Toxoids Swelling    Family History: Family History  Problem Relation Age of Onset   Arthritis Mother    Hyperlipidemia Mother    Hypertension Mother    Arthritis Father    Hyperlipidemia Father    Hypertension Father    Diabetes Father     Social History:  reports that he has quit smoking. His smoking use included cigarettes. He has a 8 pack-year smoking history. His smokeless tobacco use includes snuff. He reports that he does not drink alcohol and does not use drugs.   Physical Exam: BP (!) 149/87 (BP Location: Left Arm, Patient Position: Sitting, Cuff Size: Large)   Pulse 74   Ht 6' 2.5 (1.892 m)   Wt 249 lb  (112.9 kg)   BMI 31.54 kg/m   Constitutional:  Alert and oriented, No acute distress. HEENT: [place] AT Respiratory: Normal respiratory effort, no increased work of breathing. Psychiatric: Normal mood and affect.   Assessment & Plan:    1. Hypogonadism Testosterone  low, though no increased symptoms, and it is recommended testosterone  levels be drawn 6 hours after the morning dose.  Testosterone  refilled.  Lab visit 6 month testosterone , hematocrit.  Office visit 1 year testosterone , PSA, hematocrit.   2. BPH with LUTS Mild lower urinary tract symptoms which are not bothersome He states Dr. Kassie had discussed Urolift with him in the past. Again discussed the procedure, and he may desire this at some time in the future if his voiding symptoms worsen.  I have reviewed the above documentation for accuracy and completeness, and I agree with the above.   Glendia JAYSON Barba, MD  [facility] [address], [address] [place], [place] [address] [phone]

## 2023-08-05 ENCOUNTER — Other Ambulatory Visit: Payer: Self-pay

## 2023-08-05 ENCOUNTER — Ambulatory Visit: Payer: Self-pay

## 2023-08-05 ENCOUNTER — Emergency Department

## 2023-08-05 ENCOUNTER — Emergency Department
Admission: EM | Admit: 2023-08-05 | Discharge: 2023-08-05 | Disposition: A | Discharge status: Home / Self Care | Attending: Emergency Medicine | Admitting: Emergency Medicine

## 2023-08-05 DIAGNOSIS — R000 Tachycardia, unspecified: Secondary | ICD-10-CM | POA: Diagnosis not present

## 2023-08-05 DIAGNOSIS — K529 Noninfective gastroenteritis and colitis, unspecified: Secondary | ICD-10-CM | POA: Diagnosis not present

## 2023-08-05 DIAGNOSIS — K5730 Diverticulosis of large intestine without perforation or abscess without bleeding: Secondary | ICD-10-CM | POA: Diagnosis not present

## 2023-08-05 DIAGNOSIS — K5792 Diverticulitis of intestine, part unspecified, without perforation or abscess without bleeding: Secondary | ICD-10-CM | POA: Diagnosis not present

## 2023-08-05 DIAGNOSIS — R1032 Left lower quadrant pain: Secondary | ICD-10-CM | POA: Diagnosis not present

## 2023-08-05 DIAGNOSIS — D72829 Elevated white blood cell count, unspecified: Secondary | ICD-10-CM | POA: Diagnosis not present

## 2023-08-05 DIAGNOSIS — R109 Unspecified abdominal pain: Secondary | ICD-10-CM | POA: Diagnosis not present

## 2023-08-05 DIAGNOSIS — I10 Essential (primary) hypertension: Secondary | ICD-10-CM | POA: Diagnosis not present

## 2023-08-05 LAB — COMPREHENSIVE METABOLIC PANEL WITH GFR
ALT: 26 U/L (ref 0–44)
AST: 21 U/L (ref 15–41)
Albumin: 4.5 g/dL (ref 3.5–5.0)
Alkaline Phosphatase: 53 U/L (ref 38–126)
Anion gap: 11 (ref 5–15)
BUN: 17 mg/dL (ref 6–20)
CO2: 26 mmol/L (ref 22–32)
Calcium: 9.3 mg/dL (ref 8.9–10.3)
Chloride: 98 mmol/L (ref 98–111)
Creatinine, Ser: 0.86 mg/dL (ref 0.61–1.24)
GFR, Estimated: >60 mL/min (ref >60)
Glucose, Bld: 119 mg/dL — ABNORMAL HIGH (ref 70–99)
Potassium: 3.7 mmol/L (ref 3.5–5.1)
Sodium: 135 mmol/L (ref 135–145)
Total Bilirubin: 1.2 mg/dL (ref 0.0–1.2)
Total Protein: 7.5 g/dL (ref 6.5–8.1)

## 2023-08-05 LAB — URINALYSIS, ROUTINE W REFLEX MICROSCOPIC
Bacteria, UA: NONE SEEN
Bilirubin Urine: NEGATIVE
Glucose, UA: NEGATIVE mg/dL
Hgb urine dipstick: NEGATIVE
Ketones, ur: NEGATIVE mg/dL
Leukocytes,Ua: NEGATIVE
Nitrite: NEGATIVE
Protein, ur: 100 mg/dL — AB
Specific Gravity, Urine: 1.026 (ref 1.005–1.030)
pH: 5 (ref 5.0–8.0)

## 2023-08-05 LAB — CBC
HCT: 49.1 % (ref 39.0–52.0)
Hemoglobin: 16.2 g/dL (ref 13.0–17.0)
MCH: 28.2 pg (ref 26.0–34.0)
MCHC: 33 g/dL (ref 30.0–36.0)
MCV: 85.5 fL (ref 80.0–100.0)
Platelets: 272 10*3/uL (ref 150–400)
RBC: 5.74 MIL/uL (ref 4.22–5.81)
RDW: 14.1 % (ref 11.5–15.5)
WBC: 16.1 10*3/uL — ABNORMAL HIGH (ref 4.0–10.5)
nRBC: 0 % (ref 0.0–0.2)

## 2023-08-05 LAB — LIPASE, BLOOD: Lipase: 32 U/L (ref 11–51)

## 2023-08-05 MED ORDER — METRONIDAZOLE 500 MG/100ML IV SOLN
500.0000 mg | Freq: Once | INTRAVENOUS | Status: AC
  Administered 2023-08-05: 500 mg via INTRAVENOUS
  Filled 2023-08-05: qty 100

## 2023-08-05 MED ORDER — CIPROFLOXACIN HCL 500 MG PO TABS: 500.0000 mg | ORAL_TABLET | Freq: Two times a day (BID) | ORAL | 0 refills | Status: AC

## 2023-08-05 MED ORDER — IOHEXOL 300 MG/ML  SOLN
100.0000 mL | Freq: Once | INTRAMUSCULAR | Status: AC | PRN
  Administered 2023-08-05: 100 mL via INTRAVENOUS

## 2023-08-05 MED ORDER — METRONIDAZOLE 500 MG PO TABS: 500.0000 mg | ORAL_TABLET | Freq: Two times a day (BID) | ORAL | 0 refills | Status: AC

## 2023-08-05 MED ORDER — CIPROFLOXACIN IN D5W 400 MG/200ML IV SOLN
400.0000 mg | Freq: Once | INTRAVENOUS | Status: AC
  Administered 2023-08-05: 400 mg via INTRAVENOUS
  Filled 2023-08-05: qty 200

## 2023-08-05 NOTE — Discharge Instructions (Addendum)
 Follow-up regular doctor if not improving in 3 days.  Return if worsening.  Take medication as prescribed.

## 2023-08-05 NOTE — ED Triage Notes (Signed)
 Pt comes with c/o belly pain. Pt states it started last night. Pt states hx of diverticulitis.

## 2023-08-05 NOTE — ED Notes (Signed)
 Pt d/c home per EDP order, discharge summary reviewed, verbalize understanding. NAD, Ambulatory off unit.

## 2023-08-05 NOTE — Telephone Encounter (Signed)
 Spoke to pt when he came into the office because the E2C2  told  pt that if he got here in the next 15-20 mins he could be seen here but No one had an opening in our office and we do not do walk  ins . Pt had been scheduled with Dr Sharia Daunt on Mon 08/04/23.Pt states that he will go to the ED if he feels like he can not wait until Mon to be seen. Pt stated that he will send us  a message if he goes to ED so we can cancel appt on Mon.

## 2023-08-05 NOTE — ED Provider Notes (Signed)
 [facility] Provider Note    Event Date/Time   First MD Initiated Contact with Patient 08/05/23 1547     (approximate)   History   Abdominal Pain   HPI  Bernard Blake is a 53 y.o. male history of diverticulitis, hypertension, presents emergency department complaining of left lower quadrant pain.  Patient states about a year ago he ended up being admitted at [facility] for 1 week due to a tear in the diverticulum.  States he does not feel as bad as that but he still having a lot of pain.  Has not had anything to eat today to try and calm the intestines down.  States he had chills last night but unsure fifths where his wife had that air conditioning on.  No fever today.  No vomiting or diarrhea.  Did have some loose stool prior to arrival.  Pain in the abdomen started yesterday      Physical Exam   Triage Vital Signs: ED Triage Vitals  Encounter Vitals Group     BP 08/05/23 1509 (!) 177/70     Systolic BP Percentile --      Diastolic BP Percentile --      Pulse Rate 08/05/23 1509 (!) 37     Resp 08/05/23 1509 18     Temp 08/05/23 1509 98.5 F (36.9 C)     Temp src --      SpO2 08/05/23 1509 97 %     Weight 08/05/23 1507 250 lb (113.4 kg)     Height 08/05/23 1507 6\' 2"  (1.88 m)     Head Circumference --      Peak Flow --      Pain Score 08/05/23 1507 7     Pain Loc --      Pain Education --      Exclude from Growth Chart --     Most recent vital signs: Vitals:   08/05/23 1509 08/05/23 1745  BP: (!) 177/70 (!) 146/75  Pulse: (!) 37 (!) 102  Resp: 18 (!) 23  Temp: 98.5 F (36.9 C)   SpO2: 97% 94%     General: Awake, no distress.   CV:  Good peripheral perfusion. regular rate and  rhythm Resp:  Normal effort. Lungs CTA Abd:  No distention.  Tender in left lower quadrant Other:      ED Results / Procedures / Treatments   Labs (all labs ordered are listed, but only abnormal results are displayed) Labs Reviewed  COMPREHENSIVE METABOLIC  PANEL WITH GFR - Abnormal; Notable for the following components:      Result Value   Glucose, Bld 119 (*)    All other components within normal limits  CBC - Abnormal; Notable for the following components:   WBC 16.1 (*)    All other components within normal limits  URINALYSIS, ROUTINE W REFLEX MICROSCOPIC - Abnormal; Notable for the following components:   Color, Urine YELLOW (*)    APPearance CLEAR (*)    Protein, ur 100 (*)    All other components within normal limits  LIPASE, BLOOD     EKG     RADIOLOGY CT abdomen pelvis IV contrast    PROCEDURES:   Procedures  Critical Care: None Chief Complaint  Patient presents with   Abdominal Pain      MEDICATIONS ORDERED IN ED: Medications  metroNIDAZOLE  (FLAGYL ) IVPB 500 mg (has no administration in time range)  iohexol  (OMNIPAQUE ) 300 MG/ML solution 100 mL (100 mLs  Intravenous Contrast Given 08/05/23 1637)  ciprofloxacin  (CIPRO ) IVPB 400 mg (400 mg Intravenous New Bag/Given 08/05/23 1753)     IMPRESSION / MDM / ASSESSMENT AND PLAN / ED COURSE  I reviewed the triage vital signs and the nursing notes.                              Differential diagnosis includes, but is not limited to, diverticulitis, diverticulitis with perforation, abscess, constipation, diarrhea  Patient's presentation is most consistent with acute illness / injury with system symptoms.   Patient's labs with elevated WBC of 16 which is concerning for infection Remainder of his labs are reassuring  EKG shows some tachycardia with PVCs.  Repeat EKG shows no change, see physician read  Due to his history we will go ahead and do CT abdomen pelvis  CT was independently reviewed interpreted by me by reading radiologist report as being positive for sigmoid colitis.  No acute diverticulitis or perforation noted  With the patient having fever chills, elevated WBC along with colitis we will go ahead and give him Flagyl  and Cipro  IV while here in the  ED.  Will discharge him with oral antibiotics.   Patient tolerated antibiotics well.  Follow-up with his regular doctor if not improved in 3 days.  Return emergency department if worsening.  He is in agreement treatment plan.  Discharged stable condition.   FINAL CLINICAL IMPRESSION(S) / ED DIAGNOSES   Final diagnoses:  Acute colitis     Rx / DC Orders   ED Discharge Orders          Ordered    metroNIDAZOLE  (FLAGYL ) 500 MG tablet  2 times daily        08/05/23 1900    ciprofloxacin  (CIPRO ) 500 MG tablet  2 times daily        08/05/23 1900             Note:  This document was prepared using Dragon voice recognition software and may include unintentional dictation errors.    Delsie Figures, PA-C 08/05/23 1904    Kandee Orion, MD 08/08/23 [phone]

## 2023-08-05 NOTE — Telephone Encounter (Signed)
 Copied from CRM [id number]. Topic: Clinical - Red Word Triage >> Aug 05, 2023  1:22 PM Odilia Bennett D wrote: Reason from [id number] called stating he is having a diverticulitis flare up and has had it the past and the doctor is aware. Patient want to know if he need to be seen to get antibiotics. Patient stated he is having pain  Chief Complaint: abd pain, cramps, achey, hx divertic Symptoms: see above Frequency: comes and goes Pertinent Negatives: Patient denies diarrhea, n/v Disposition: [] ED /[] Urgent Care (no appt availability in office) / [x] Appointment(In office/virtual)/ []  [facility]/ [] Home Care/ [] Refused Recommended Disposition /[]  Mobile Bus/ []  Follow-up with PCP Additional Notes: apt made for today; care advice given, denies questions; instructed to go to ER if becomes worse.   Reason for Disposition  [1] MODERATE pain (e.g., interferes with normal activities) AND [2] pain comes and goes (cramps) AND [3] present > 24 hours  (Exception: Pain with Vomiting or Diarrhea - see that Guideline.)  Answer Assessment - Initial Assessment Questions 1. LOCATION: "Where does it hurt?"      Abd around the belt line 2. RADIATION: "Does the pain shoot anywhere else?" (e.g., chest, back)     States crampy and achey at times 3. ONSET: "When did the pain begin?" (Minutes, hours or days ago)      yesterday 4. SUDDEN: "Gradual or sudden onset?"     suddenly 5. PATTERN "Does the pain come and go, or is it constant?"    - If it comes and goes: "How long does it last?" "Do you have pain now?"     (Note: Comes and goes means the pain is intermittent. It goes away completely between bouts.)    - If constant: "Is it getting better, staying the same, or getting worse?"      (Note: Constant means the pain never goes away completely; most serious pain is constant and gets worse.)      Comes and goes 6. SEVERITY: "How bad is the pain?"  (e.g., Scale 1-10; mild, moderate, or severe)    -  MILD (1-3): Doesn't interfere with normal activities, abdomen soft and not tender to touch.     - MODERATE (4-7): Interferes with normal activities or awakens from sleep, abdomen tender to touch.     - SEVERE (8-10): Excruciating pain, doubled over, unable to do any normal activities.       Moderate to severe 7. RECURRENT SYMPTOM: "Have you ever had this type of stomach pain before?" If Yes, ask: "When was the last time?" and "What happened that time?"      Yes, has diverticulitis 8. CAUSE: "What do you think is causing the stomach pain?"     See above 9. RELIEVING/AGGRAVATING FACTORS: "What makes it better or worse?" (e.g., antacids, bending or twisting motion, bowel movement)     na 10. OTHER SYMPTOMS: "Do you have any other symptoms?" (e.g., back pain, diarrhea, fever, urination pain, vomiting)       fever  Protocols used: Abdominal Pain - Male-A-AH

## 2023-08-08 ENCOUNTER — Ambulatory Visit: Admitting: Internal Medicine

## 2023-08-08 NOTE — Telephone Encounter (Signed)
 Seen in ED 08/05/23

## 2023-08-08 NOTE — Telephone Encounter (Signed)
 Spoke to pot he stated that he is feeling a little better trying to do what they told him to do getting his fluids and eat better, he has appt scheduled for f/up on 08/12/23

## 2023-08-12 ENCOUNTER — Ambulatory Visit: Admitting: Family

## 2023-08-12 ENCOUNTER — Encounter: Payer: Self-pay | Admitting: Family

## 2023-08-12 VITALS — BP 126/82 | HR 69 | Temp 97.6°F | Ht 74.0 in | Wt 243.8 lb

## 2023-08-12 DIAGNOSIS — K529 Noninfective gastroenteritis and colitis, unspecified: Secondary | ICD-10-CM | POA: Diagnosis not present

## 2023-08-12 DIAGNOSIS — I10 Essential (primary) hypertension: Secondary | ICD-10-CM

## 2023-08-12 DIAGNOSIS — F32A Depression, unspecified: Secondary | ICD-10-CM | POA: Diagnosis not present

## 2023-08-12 DIAGNOSIS — K5792 Diverticulitis of intestine, part unspecified, without perforation or abscess without bleeding: Secondary | ICD-10-CM

## 2023-08-12 LAB — COMPREHENSIVE METABOLIC PANEL WITH GFR
ALT: 20 U/L (ref 0–53)
AST: 16 U/L (ref 0–37)
Albumin: 4.4 g/dL (ref 3.5–5.2)
Alkaline Phosphatase: 52 U/L (ref 39–117)
BUN: 17 mg/dL (ref 6–23)
CO2: 32 meq/L (ref 19–32)
Calcium: 9.2 mg/dL (ref 8.4–10.5)
Chloride: 98 meq/L (ref 96–112)
Creatinine, Ser: 0.88 mg/dL (ref 0.40–1.50)
GFR: 97.28 mL/min (ref >60.00)
Glucose, Bld: 115 mg/dL — ABNORMAL HIGH (ref 70–99)
Potassium: 3.9 meq/L (ref 3.5–5.1)
Sodium: 138 meq/L (ref 135–145)
Total Bilirubin: 0.3 mg/dL (ref 0.2–1.2)
Total Protein: 6.7 g/dL (ref 6.0–8.3)

## 2023-08-12 LAB — CBC WITH DIFFERENTIAL/PLATELET
Basophils Absolute: 0.1 10*3/uL (ref 0.0–0.1)
Basophils Relative: 1.2 % (ref 0.0–3.0)
Eosinophils Absolute: 0.1 10*3/uL (ref 0.0–0.7)
Eosinophils Relative: 2.1 % (ref 0.0–5.0)
HCT: 42.9 % (ref 39.0–52.0)
Hemoglobin: 14.3 g/dL (ref 13.0–17.0)
Lymphocytes Relative: 19 % (ref 12.0–46.0)
Lymphs Abs: 1.2 10*3/uL (ref 0.7–4.0)
MCHC: 33.3 g/dL (ref 30.0–36.0)
MCV: 83.6 fl (ref 78.0–100.0)
Monocytes Absolute: 0.8 10*3/uL (ref 0.1–1.0)
Monocytes Relative: 12 % (ref 3.0–12.0)
Neutro Abs: 4.3 10*3/uL (ref 1.4–7.7)
Neutrophils Relative %: 65.7 % (ref 43.0–77.0)
Platelets: 408 10*3/uL — ABNORMAL HIGH (ref 150.0–400.0)
RBC: 5.13 Mil/uL (ref 4.22–5.81)
RDW: 14.3 % (ref 11.5–15.5)
WBC: 6.6 10*3/uL (ref 4.0–10.5)

## 2023-08-12 LAB — MICROALBUMIN / CREATININE URINE RATIO
Creatinine,U: 235.8 mg/dL
Microalb Creat Ratio: 58.4 mg/g — ABNORMAL HIGH (ref 0.0–30.0)
Microalb, Ur: 13.8 mg/dL — ABNORMAL HIGH (ref 0.0–1.9)

## 2023-08-12 MED ORDER — BENAZEPRIL HCL 10 MG PO TABS: ORAL_TABLET | ORAL | 2 refills | Status: DC

## 2023-08-12 MED ORDER — CITALOPRAM HYDROBROMIDE 20 MG PO TABS: 20.0000 mg | ORAL_TABLET | Freq: Every day | ORAL | 3 refills | Status: AC

## 2023-08-12 NOTE — Progress Notes (Signed)
 Assessment & Plan:   Diverticulitis -     CBC with Differential/Platelet  Essential hypertension -     Benazepril  HCl; TAKE 1 TABLET(10 MG) BY MOUTH DAILY  Dispense: 90 tablet; Refill: 2 -     Comprehensive metabolic panel with GFR -     Microalbumin / creatinine urine ratio  Depression, unspecified depression type -     Citalopram  Hydrobromide; Take 1 tablet (20 mg total) by mouth daily. TAKE 1 TABLET(20 MG) BY MOUTH DAILY  Dispense: 90 tablet; Refill: 3  Colitis Assessment & Plan: Clinically improved. Reviewed ED visit. Reassuring abdominal exam. He will continue to advance diet. Start probiotics.       Return precautions given.   Risks, benefits, and alternatives of the medications and treatment plan prescribed today were discussed, and patient expressed understanding.   Education regarding symptom management and diagnosis given to patient on AVS either electronically or printed.  Return in about 6 months (around 02/12/2024).  Bascom Bossier, FNP  Subjective:    Patient ID: Bernard Blake, male    DOB: June 04, 1970, 53 y.o.   MRN: [id number]  CC: Bernard Blake is a 53 y.o. male who presents today for an acute visit.    HPI: ED follow-up He is feeling well today Fatigue,abdoninal pain, LLQ pain has resolved.  He is eating nearly his normal,slightly decreased.  Staying hydrated.   He will finish antibiotics today.     Presents emergency room for left lower quadrant pain. Associated with chills ED 08/04/2023 for diagnosed acute colitis WBC 16.1 Lipase 32  Urine with protein  08/05/23 CT a/p  Circumferential mural thickening the sigmoid colon spanning approximately 10 cm with pericolonic stranding and free fluid. Provided ciprofloxacin  500 mg twice daily 7 days, metronidazole  500 mg BID 7 days  History of diverticulitis with perforation [facility] 7/24/4   Surgical Consult  Dr Irena Manners 10/26/2022 to discuss sigmoidectomy. RTC colonoscopy and discuss elective  sigmoidectomy after.  Colonoscopy 02/11/2023, repeat in 3 years  Allergies: Tetanus toxoid, adsorbed and Tetanus toxoids Current Outpatient Medications on File Prior to Visit  Medication Sig Dispense Refill   Ascorbic Acid (VITAMIN C) 1000 MG tablet Take 2,000 mg by mouth daily.     Berberine Chloride (BERBERINE HCI PO) Take 1 tablet by mouth daily.     Cetirizine HCl (ZYRTEC PO) Take by mouth.     cholecalciferol (VITAMIN D ) 1000 UNITS tablet Take 2,000 Units by mouth daily.     ciprofloxacin  (CIPRO ) 500 MG tablet Take 1 tablet (500 mg total) by mouth 2 (two) times daily for 7 days. 14 tablet 0   Co-Enzyme Q-10 100 MG CAPS Take 100 mg by mouth.     JATENZO  237 MG CAPS Take 1 capsule (237 mg total) by mouth 2 (two) times daily. 60 capsule 3   Loratadine (CLARITIN PO) Take by mouth.     magnesium 30 MG tablet Take 30 mg by mouth 2 (two) times daily.     meloxicam  (MOBIC ) 7.5 MG tablet TAKE 1 TABLET(7.5 MG) BY MOUTH DAILY AS NEEDED FOR PAIN 30 tablet 2   Menaquinone-7 (VITAMIN K2 PO) Take by mouth.     metroNIDAZOLE  (FLAGYL ) 500 MG tablet Take 1 tablet (500 mg total) by mouth 2 (two) times daily for 7 days. 14 tablet 0   Omega-3 Fatty Acids (FISH OIL) 1000 MG CAPS Take 2,400 mg by mouth once.     Turmeric (QC TUMERIC COMPLEX PO) Take by mouth. Takes as directed  Zinc Sulfate 66 MG TABS Take 15 mg by mouth.     No current facility-administered medications on file prior to visit.    Review of Systems  Constitutional:  Negative for chills and fever.  Respiratory:  Negative for cough.   Cardiovascular:  Negative for chest pain and palpitations.  Gastrointestinal:  Negative for abdominal pain (resolved), nausea and vomiting.      Objective:    BP 126/82   Pulse 69   Temp 97.6 F (36.4 C)   Ht 6\' 2"  (1.88 m)   Wt 243 lb 12.8 oz (110.6 kg)   SpO2 96%   BMI 31.30 kg/m   BP Readings from Last 3 Encounters:  08/12/23 126/82  08/05/23 (!) 152/88  06/24/23 (!) 149/87   Wt  Readings from Last 3 Encounters:  08/12/23 243 lb 12.8 oz (110.6 kg)  08/05/23 250 lb (113.4 kg)  06/24/23 249 lb (112.9 kg)    Physical Exam Vitals reviewed.  Constitutional:      Appearance: He is well-developed.  Cardiovascular:     Rate and Rhythm: Regular rhythm.     Heart sounds: Normal heart sounds.  Pulmonary:     Effort: Pulmonary effort is normal. No respiratory distress.     Breath sounds: Normal breath sounds. No wheezing, rhonchi or rales.  Skin:    General: Skin is warm and dry.  Neurological:     Mental Status: He is alert.  Psychiatric:        Speech: Speech normal.        Behavior: Behavior normal.

## 2023-08-12 NOTE — Patient Instructions (Signed)
 Ensure to take probiotics while on antibiotics and also for 2 weeks after completion. This can either be by eating yogurt daily or taking a probiotic supplement over the counter such as Culturelle.It is important to re-colonize the gut with good bacteria and also to prevent any diarrheal infections associated with antibiotic use.    Nice to see you today.

## 2023-08-12 NOTE — Assessment & Plan Note (Addendum)
 Clinically improved. Reviewed ED visit. Reassuring abdominal exam. He will continue to advance diet. Start probiotics.

## 2023-08-18 ENCOUNTER — Ambulatory Visit: Payer: Self-pay | Admitting: Family

## 2023-08-18 DIAGNOSIS — R809 Proteinuria, unspecified: Secondary | ICD-10-CM

## 2023-08-18 MED ORDER — BENAZEPRIL HCL 5 MG PO TABS: 5.0000 mg | ORAL_TABLET | Freq: Every day | ORAL | 3 refills | Status: DC

## 2023-08-18 NOTE — Telephone Encounter (Signed)
 Copied from CRM [id number]. Topic: Clinical - Lab/Test Results >> Aug 18, 2023 10:58 AM Bernard Blake wrote: Reason for CRM: Bernard Blake called back, relayed results to Bernard Blake and Bernard Blake voiced understanding. We were able to schedule patients follow up testing following taking the increase in medication and follow up appointment with physician. Bernard Blake stated he would be able to pick medication up today but would have to push follow up testing out a little further than a week due to his job, I let Bernard Blake know I would inform his clinical team. He also asked for the July 16 date for an appointment because he will be off that day.

## 2023-08-26 ENCOUNTER — Other Ambulatory Visit: Payer: Self-pay | Admitting: *Deleted

## 2023-08-26 DIAGNOSIS — I10 Essential (primary) hypertension: Secondary | ICD-10-CM

## 2023-08-30 ENCOUNTER — Other Ambulatory Visit (INDEPENDENT_AMBULATORY_CARE_PROVIDER_SITE_OTHER)

## 2023-08-30 DIAGNOSIS — I10 Essential (primary) hypertension: Secondary | ICD-10-CM

## 2023-08-30 NOTE — Addendum Note (Signed)
 Addended by: Gradie Lawless A on: 08/30/2023 03:44 PM   Modules accepted: Orders

## 2023-08-31 ENCOUNTER — Ambulatory Visit: Payer: Self-pay | Admitting: Family

## 2023-08-31 LAB — BASIC METABOLIC PANEL WITH GFR
BUN/Creatinine Ratio: 26 — ABNORMAL HIGH (ref 9–20)
BUN: 23 mg/dL (ref 6–24)
CO2: 23 mmol/L (ref 20–29)
Calcium: 9.5 mg/dL (ref 8.7–10.2)
Chloride: 100 mmol/L (ref 96–106)
Creatinine, Ser: 0.88 mg/dL (ref 0.76–1.27)
Glucose: 90 mg/dL (ref 70–99)
Potassium: 4.2 mmol/L (ref 3.5–5.2)
Sodium: 140 mmol/L (ref 134–144)
eGFR: 102 mL/min/{1.73_m2} (ref >59)

## 2023-09-28 ENCOUNTER — Encounter: Payer: Self-pay | Admitting: Urology

## 2023-09-28 ENCOUNTER — Other Ambulatory Visit: Payer: Self-pay | Admitting: *Deleted

## 2023-09-28 MED ORDER — JATENZO 237 MG PO CAPS: 1.0000 | ORAL_CAPSULE | Freq: Two times a day (BID) | ORAL | 3 refills | Status: DC

## 2023-10-05 ENCOUNTER — Encounter: Payer: Self-pay | Admitting: Family

## 2023-10-05 ENCOUNTER — Ambulatory Visit: Admitting: Family

## 2023-10-05 ENCOUNTER — Other Ambulatory Visit: Payer: Self-pay | Admitting: *Deleted

## 2023-10-05 ENCOUNTER — Ambulatory Visit

## 2023-10-05 VITALS — BP 126/70 | HR 64 | Temp 97.6°F | Ht 74.0 in | Wt 240.0 lb

## 2023-10-05 DIAGNOSIS — Z136 Encounter for screening for cardiovascular disorders: Secondary | ICD-10-CM | POA: Diagnosis not present

## 2023-10-05 DIAGNOSIS — F32A Depression, unspecified: Secondary | ICD-10-CM | POA: Diagnosis not present

## 2023-10-05 DIAGNOSIS — I10 Essential (primary) hypertension: Secondary | ICD-10-CM

## 2023-10-05 DIAGNOSIS — Z13220 Encounter for screening for lipoid disorders: Secondary | ICD-10-CM | POA: Diagnosis not present

## 2023-10-05 LAB — LIPID PANEL
Cholesterol: 194 mg/dL (ref 0–200)
HDL: 32.4 mg/dL — ABNORMAL LOW (ref >39.00)
LDL Cholesterol: 131 mg/dL — ABNORMAL HIGH (ref 0–99)
NonHDL: 161.46
Total CHOL/HDL Ratio: 6
Triglycerides: 152 mg/dL — ABNORMAL HIGH (ref 0.0–149.0)
VLDL: 30.4 mg/dL (ref 0.0–40.0)

## 2023-10-05 LAB — MICROALBUMIN / CREATININE URINE RATIO
Creatinine,U: 83.9 mg/dL
Microalb Creat Ratio: 39.3 mg/g — ABNORMAL HIGH (ref 0.0–30.0)
Microalb, Ur: 3.3 mg/dL — ABNORMAL HIGH (ref 0.0–1.9)

## 2023-10-05 MED ORDER — JATENZO 237 MG PO CAPS: 1.0000 | ORAL_CAPSULE | Freq: Two times a day (BID) | ORAL | 3 refills | Status: DC

## 2023-10-05 NOTE — Progress Notes (Signed)
 Assessment & Plan:  Essential hypertension Assessment & Plan: Excellent control. Continue benazepril  15mg  every day.  Pending urine protein  Orders: -     Microalbumin / creatinine urine ratio  Encounter for lipid screening for cardiovascular disease -     Lipid panel  Depression, unspecified depression type Assessment & Plan: Excellent control. Seasonal. Advised due to preference to wean off of  Celexa  during spring /summer and to resume in the Fall/winter. Counseled on the importance of weaning.       Return precautions given.   Risks, benefits, and alternatives of the medications and treatment plan prescribed today were discussed, and patient expressed understanding.   Education regarding symptom management and diagnosis given to patient on AVS either electronically or printed.  No follow-ups on file.  Rollene Northern, FNP  Subjective:    Patient ID: Bernard Blake, male    DOB: 1970/05/16, 53 y.o.   MRN: [id number]  CC: Bernard Blake is a 53 y.o. male who presents today for medication follow up.   HPI: F/u proteinuria He has compliant with benazepril  15mg  every day  Depression is markedly improved. Especially in the warmer months. He will start and stop celexa  every couple of months. He will resume in the Fall.      Allergies: Tetanus toxoid, adsorbed and Tetanus toxoids Current Outpatient Medications on File Prior to Visit  Medication Sig Dispense Refill   Ascorbic Acid (VITAMIN C) 1000 MG tablet Take 2,000 mg by mouth daily.     benazepril  (LOTENSIN ) 10 MG tablet TAKE 1 TABLET(10 MG) BY MOUTH DAILY 90 tablet 2   benazepril  (LOTENSIN ) 5 MG tablet Take 1 tablet (5 mg total) by mouth daily. 90 tablet 3   Berberine Chloride (BERBERINE HCI PO) Take 1 tablet by mouth daily.     Cetirizine HCl (ZYRTEC PO) Take by mouth.     cholecalciferol (VITAMIN D ) 1000 UNITS tablet Take 2,000 Units by mouth daily.     citalopram  (CELEXA ) 20 MG tablet Take 1 tablet (20 mg  total) by mouth daily. TAKE 1 TABLET(20 MG) BY MOUTH DAILY 90 tablet 3   Co-Enzyme Q-10 100 MG CAPS Take 100 mg by mouth.     JATENZO  237 MG CAPS Take 1 capsule (237 mg total) by mouth 2 (two) times daily. 60 capsule 3   Loratadine (CLARITIN PO) Take by mouth.     magnesium 30 MG tablet Take 30 mg by mouth 2 (two) times daily.     meloxicam  (MOBIC ) 7.5 MG tablet TAKE 1 TABLET(7.5 MG) BY MOUTH DAILY AS NEEDED FOR PAIN 30 tablet 2   Menaquinone-7 (VITAMIN K2 PO) Take by mouth.     Omega-3 Fatty Acids (FISH OIL) 1000 MG CAPS Take 2,400 mg by mouth once.     Turmeric (QC TUMERIC COMPLEX PO) Take by mouth. Takes as directed     Zinc Sulfate 66 MG TABS Take 15 mg by mouth.     No current facility-administered medications on file prior to visit.    Review of Systems  Constitutional:  Negative for chills and fever.  Respiratory:  Negative for cough.   Cardiovascular:  Negative for chest pain and palpitations.  Gastrointestinal:  Negative for nausea and vomiting.      Objective:    BP 126/70   Pulse 64   Temp 97.6 F (36.4 C) (Oral)   Ht 6' 2 (1.88 m)   Wt 240 lb (108.9 kg)   SpO2 98%   BMI 30.81 kg/m  BP  Readings from Last 3 Encounters:  10/05/23 126/70  08/12/23 126/82  08/05/23 (!) 152/88   Wt Readings from Last 3 Encounters:  10/05/23 240 lb (108.9 kg)  08/12/23 243 lb 12.8 oz (110.6 kg)  08/05/23 250 lb (113.4 kg)    Physical Exam Vitals reviewed.  Constitutional:      Appearance: He is well-developed.  Cardiovascular:     Rate and Rhythm: Regular rhythm.     Heart sounds: Normal heart sounds.  Pulmonary:     Effort: Pulmonary effort is normal. No respiratory distress.     Breath sounds: Normal breath sounds. No wheezing, rhonchi or rales.  Skin:    General: Skin is warm and dry.  Neurological:     Mental Status: He is alert.  Psychiatric:        Speech: Speech normal.        Behavior: Behavior normal.

## 2023-10-05 NOTE — Assessment & Plan Note (Signed)
 Excellent control. Continue benazepril  15mg  every day.  Pending urine protein

## 2023-10-05 NOTE — Patient Instructions (Signed)
 Resume celexa  in the Fall and taper off in the spring by taking 1 tablet every other day for one week and then stop.

## 2023-10-05 NOTE — Assessment & Plan Note (Addendum)
 Excellent control. Seasonal. Advised due to preference to wean off of  Celexa  during spring /summer and to resume in the Fall/winter. Counseled on the importance of weaning.

## 2023-10-10 ENCOUNTER — Ambulatory Visit: Payer: Self-pay | Admitting: Family

## 2023-10-10 ENCOUNTER — Encounter: Payer: Self-pay | Admitting: Family

## 2023-11-15 ENCOUNTER — Encounter: Payer: Self-pay | Admitting: Family

## 2023-11-15 ENCOUNTER — Other Ambulatory Visit: Payer: Self-pay | Admitting: Family

## 2023-11-15 DIAGNOSIS — E785 Hyperlipidemia, unspecified: Secondary | ICD-10-CM

## 2023-11-29 ENCOUNTER — Ambulatory Visit: Payer: Self-pay | Admitting: Family

## 2023-11-29 ENCOUNTER — Ambulatory Visit
Admission: RE | Admit: 2023-11-29 | Discharge: 2023-11-29 | Disposition: A | Discharge status: Home / Self Care | Payer: Self-pay | Source: Ambulatory Visit | Attending: Family

## 2023-11-29 DIAGNOSIS — E785 Hyperlipidemia, unspecified: Secondary | ICD-10-CM | POA: Insufficient documentation

## 2023-12-20 ENCOUNTER — Other Ambulatory Visit

## 2023-12-20 DIAGNOSIS — E291 Testicular hypofunction: Secondary | ICD-10-CM

## 2023-12-20 DIAGNOSIS — Z125 Encounter for screening for malignant neoplasm of prostate: Secondary | ICD-10-CM

## 2023-12-21 LAB — TESTOSTERONE: Testosterone: 168 ng/dL — ABNORMAL LOW (ref 264–916)

## 2023-12-21 LAB — HEMATOCRIT: Hematocrit: 51.7 % — ABNORMAL HIGH (ref 37.5–51.0)

## 2023-12-22 ENCOUNTER — Ambulatory Visit: Payer: Self-pay | Admitting: Urology

## 2024-02-01 DIAGNOSIS — D2262 Melanocytic nevi of left upper limb, including shoulder: Secondary | ICD-10-CM | POA: Diagnosis not present

## 2024-02-01 DIAGNOSIS — D2261 Melanocytic nevi of right upper limb, including shoulder: Secondary | ICD-10-CM | POA: Diagnosis not present

## 2024-02-01 DIAGNOSIS — L720 Epidermal cyst: Secondary | ICD-10-CM | POA: Diagnosis not present

## 2024-02-01 DIAGNOSIS — D225 Melanocytic nevi of trunk: Secondary | ICD-10-CM | POA: Diagnosis not present

## 2024-02-01 DIAGNOSIS — R208 Other disturbances of skin sensation: Secondary | ICD-10-CM | POA: Diagnosis not present

## 2024-02-01 DIAGNOSIS — L538 Other specified erythematous conditions: Secondary | ICD-10-CM | POA: Diagnosis not present

## 2024-02-01 DIAGNOSIS — D2272 Melanocytic nevi of left lower limb, including hip: Secondary | ICD-10-CM | POA: Diagnosis not present

## 2024-02-01 DIAGNOSIS — L728 Other follicular cysts of the skin and subcutaneous tissue: Secondary | ICD-10-CM | POA: Diagnosis not present

## 2024-02-01 DIAGNOSIS — L905 Scar conditions and fibrosis of skin: Secondary | ICD-10-CM | POA: Diagnosis not present

## 2024-02-09 ENCOUNTER — Encounter: Payer: Self-pay | Admitting: Family

## 2024-02-09 ENCOUNTER — Ambulatory Visit: Admitting: Family

## 2024-02-09 VITALS — BP 130/72 | HR 78 | Temp 98.2°F | Ht 74.0 in | Wt 233.6 lb

## 2024-02-09 DIAGNOSIS — E785 Hyperlipidemia, unspecified: Secondary | ICD-10-CM | POA: Diagnosis not present

## 2024-02-09 DIAGNOSIS — F32A Depression, unspecified: Secondary | ICD-10-CM | POA: Diagnosis not present

## 2024-02-09 DIAGNOSIS — I10 Essential (primary) hypertension: Secondary | ICD-10-CM | POA: Diagnosis not present

## 2024-02-09 DIAGNOSIS — R809 Proteinuria, unspecified: Secondary | ICD-10-CM | POA: Diagnosis not present

## 2024-02-09 MED ORDER — BENAZEPRIL HCL 5 MG PO TABS: 5.0000 mg | ORAL_TABLET | Freq: Every day | ORAL | 3 refills | Status: AC

## 2024-02-09 MED ORDER — BENAZEPRIL HCL 10 MG PO TABS: ORAL_TABLET | ORAL | 3 refills | Status: AC

## 2024-02-09 NOTE — Progress Notes (Signed)
 Assessment & Plan:  Essential hypertension Assessment & Plan: Excellent control. Continue benazepril  15mg  every day.    Orders: -     Hemoglobin A1c -     Lipid panel -     Comprehensive metabolic panel with GFR -     Benazepril  HCl; TAKE 1 TABLET(10 MG) BY MOUTH DAILY  Dispense: 90 tablet; Refill: 3  Proteinuria, unspecified type -     Benazepril  HCl; Take 1 tablet (5 mg total) by mouth daily.  Dispense: 90 tablet; Refill: 3 -     Microalbumin / creatinine urine ratio  Hyperlipidemia, unspecified hyperlipidemia type Assessment & Plan:  CT calcium score 11/29/2023, coronary calcium score of 0. The 10-year ASCVD risk score (Pearl Berlinger DK, et al., 2019) is: 9.5% Discussed carnivore diet and advised if cholesterol increased would recommend overall decrease in red meat consumption.  Continue following cholesterol .   Depression, unspecified depression type Assessment & Plan: PHQ and GAD7 score of 0.  He has stop taking Celexa  20 mg.  Will continue to monitor.      Return precautions given.   Risks, benefits, and alternatives of the medications and treatment plan prescribed today were discussed, and patient expressed understanding.   Education regarding symptom management and diagnosis given to patient on AVS either electronically or printed.  No follow-ups on file.  Bernard Northern, FNP  Subjective:    Patient ID: Bernard Blake, male    DOB: Aug 03, 1970, 53 y.o.   MRN: [id number]  CC: Bernard Blake is a 53 y.o. male who presents today for follow up.   HPI: HPI Discussed the use of AI scribe software for clinical note transcription with the patient, who gave verbal consent to proceed.  History of Present Illness   Bernard Blake is a 53 year old male who presents for a follow-up visit for HTN.   He has intentionally lost 10 pounds since May, reducing his weight from 243 pounds to 233 pounds. This weight loss is attributed to dietary changes, specifically intermittent  fasting and a carnivore diet. His daily intake includes a 'carnivore dinner' of steak, avocado, and butter.   CT calcium score 11/29/2023, coronary calcium score of 0  He has a history of proteinuria   Compliant with benazepril  15mg   Denies chest pain, shortness of breath  He has stopped taking Celexa  20 mg. Allergies: Tetanus toxoid, adsorbed and Tetanus toxoid-containing vaccines Current Outpatient Medications on File Prior to Visit  Medication Sig Dispense Refill   Ascorbic Acid (VITAMIN C) 1000 MG tablet Take 2,000 mg by mouth daily.     Berberine Chloride (BERBERINE HCI PO) Take 1 tablet by mouth daily.     Cetirizine HCl (ZYRTEC PO) Take by mouth.     cholecalciferol (VITAMIN D ) 1000 UNITS tablet Take 2,000 Units by mouth daily.     Co-Enzyme Q-10 100 MG CAPS Take 100 mg by mouth.     JATENZO  237 MG CAPS Take 1 capsule (237 mg total) by mouth 2 (two) times daily. 60 capsule 3   Loratadine (CLARITIN PO) Take by mouth.     magnesium 30 MG tablet Take 30 mg by mouth 2 (two) times daily.     meloxicam  (MOBIC ) 7.5 MG tablet TAKE 1 TABLET(7.5 MG) BY MOUTH DAILY AS NEEDED FOR PAIN 30 tablet 2   Menaquinone-7 (VITAMIN K2 PO) Take by mouth.     Omega-3 Fatty Acids (FISH OIL) 1000 MG CAPS Take 2,400 mg by mouth once.     Turmeric (QC TUMERIC  COMPLEX PO) Take by mouth. Takes as directed     Zinc Sulfate 66 MG TABS Take 15 mg by mouth.     citalopram  (CELEXA ) 20 MG tablet Take 1 tablet (20 mg total) by mouth daily. TAKE 1 TABLET(20 MG) BY MOUTH DAILY (Patient not taking: Reported on 02/09/2024) 90 tablet 3   No current facility-administered medications on file prior to visit.    Review of Systems  Constitutional:  Negative for chills and fever.  Respiratory:  Negative for cough.   Cardiovascular:  Negative for chest pain and palpitations.  Gastrointestinal:  Negative for nausea and vomiting.      Objective:    BP 130/72   Pulse 78   Temp 98.2 F (36.8 C) (Oral)   Ht 6' 2 (1.88 m)    Wt 233 lb 9.6 oz (106 kg)   SpO2 98%   BMI 29.99 kg/m  BP Readings from Last 3 Encounters:  02/09/24 130/72  10/05/23 126/70  08/12/23 126/82   Wt Readings from Last 3 Encounters:  02/09/24 233 lb 9.6 oz (106 kg)  10/05/23 240 lb (108.9 kg)  08/12/23 243 lb 12.8 oz (110.6 kg)      02/09/2024    2:02 PM 10/05/2023   10:15 AM 08/12/2023   10:54 AM  Depression screen PHQ 2/9  Decreased Interest 0 0 1  Down, Depressed, Hopeless 0 0 1  PHQ - 2 Score 0 0 2  Altered sleeping  0 1  Tired, decreased energy  0 1  Change in appetite  0 1  Feeling bad or failure about yourself   0 0  Trouble concentrating  0 0  Moving slowly or fidgety/restless  0 0  Suicidal thoughts  0 0  PHQ-9 Score  0  5   Difficult doing work/chores  Not difficult at all Not difficult at all     Data saved with a previous flowsheet row definition      10/05/2023   10:16 AM 08/12/2023   10:55 AM 05/19/2022    3:42 PM 01/13/2022    3:34 PM  GAD 7 : Generalized Anxiety Score  Nervous, Anxious, on Edge 0 0 0 1  Control/stop worrying 0 1 1 1   Worry too much - different things 0 1 1 1   Trouble relaxing 0 0 0 0  Restless 0 0 0 0  Easily annoyed or irritable 0 1 0 0  Afraid - awful might happen 0 0 0 0  Total GAD 7 Score 0 3 2 3   Anxiety Difficulty Not difficult at all Not difficult at all Not difficult at all Not difficult at all      Physical Exam Vitals reviewed.  Constitutional:      Appearance: He is well-developed.  Cardiovascular:     Rate and Rhythm: Regular rhythm.     Heart sounds: Normal heart sounds.  Pulmonary:     Effort: Pulmonary effort is normal. No respiratory distress.     Breath sounds: Normal breath sounds. No wheezing, rhonchi or rales.  Skin:    General: Skin is warm and dry.  Neurological:     Mental Status: He is alert.  Psychiatric:        Speech: Speech normal.        Behavior: Behavior normal.

## 2024-02-10 ENCOUNTER — Encounter: Payer: Self-pay | Admitting: Urology

## 2024-02-10 LAB — COMPREHENSIVE METABOLIC PANEL WITH GFR
ALT: 19 U/L (ref 0–53)
AST: 19 U/L (ref 0–37)
Albumin: 5.1 g/dL (ref 3.5–5.2)
Alkaline Phosphatase: 72 U/L (ref 39–117)
BUN: 16 mg/dL (ref 6–23)
CO2: 32 meq/L (ref 19–32)
Calcium: 9.7 mg/dL (ref 8.4–10.5)
Chloride: 96 meq/L (ref 96–112)
Creatinine, Ser: 0.91 mg/dL (ref 0.40–1.50)
GFR: 95.01 mL/min (ref >60.00)
Glucose, Bld: 91 mg/dL (ref 70–99)
Potassium: 4.4 meq/L (ref 3.5–5.1)
Sodium: 137 meq/L (ref 135–145)
Total Bilirubin: 0.6 mg/dL (ref 0.2–1.2)
Total Protein: 7.4 g/dL (ref 6.0–8.3)

## 2024-02-10 LAB — LIPID PANEL
Cholesterol: 206 mg/dL — ABNORMAL HIGH (ref 0–200)
HDL: 34.6 mg/dL — ABNORMAL LOW (ref >39.00)
LDL Cholesterol: 149 mg/dL — ABNORMAL HIGH (ref 0–99)
NonHDL: 171.6
Total CHOL/HDL Ratio: 6
Triglycerides: 115 mg/dL (ref 0.0–149.0)
VLDL: 23 mg/dL (ref 0.0–40.0)

## 2024-02-10 LAB — MICROALBUMIN / CREATININE URINE RATIO
Creatinine,U: 85 mg/dL
Microalb Creat Ratio: 37.2 mg/g — ABNORMAL HIGH (ref 0.0–30.0)
Microalb, Ur: 3.2 mg/dL — ABNORMAL HIGH (ref 0.0–1.9)

## 2024-02-10 LAB — HEMOGLOBIN A1C: Hgb A1c MFr Bld: 5.8 % (ref 4.6–6.5)

## 2024-02-11 DIAGNOSIS — E785 Hyperlipidemia, unspecified: Secondary | ICD-10-CM | POA: Insufficient documentation

## 2024-02-11 NOTE — Assessment & Plan Note (Signed)
 Excellent control. Continue benazepril  15mg  every day.

## 2024-02-11 NOTE — Assessment & Plan Note (Signed)
 CT calcium score 11/29/2023, coronary calcium score of 0. The 10-year ASCVD risk score (Bernard Blake DK, et al., 2019) is: 9.5% Discussed carnivore diet and advised if cholesterol increased would recommend overall decrease in red meat consumption.  Continue following cholesterol .

## 2024-02-11 NOTE — Assessment & Plan Note (Signed)
 PHQ and GAD7 score of 0.  He has stop taking Celexa  20 mg.  Will continue to monitor.

## 2024-02-13 ENCOUNTER — Other Ambulatory Visit: Payer: Self-pay | Admitting: Family

## 2024-02-13 ENCOUNTER — Telehealth: Payer: Self-pay

## 2024-02-13 ENCOUNTER — Other Ambulatory Visit: Payer: Self-pay

## 2024-02-13 DIAGNOSIS — I10 Essential (primary) hypertension: Secondary | ICD-10-CM

## 2024-02-13 NOTE — Telephone Encounter (Signed)
 LVM for pt to call us  back and let us  know what pharmacy we are sending the [place] to.

## 2024-02-14 MED ORDER — JATENZO 237 MG PO CAPS: 1.0000 | ORAL_CAPSULE | Freq: Two times a day (BID) | ORAL | 3 refills | Status: AC

## 2024-02-22 ENCOUNTER — Ambulatory Visit: Payer: Self-pay | Admitting: Family

## 2024-02-22 ENCOUNTER — Encounter: Payer: Self-pay | Admitting: Family

## 2024-06-19 ENCOUNTER — Other Ambulatory Visit

## 2024-06-22 ENCOUNTER — Ambulatory Visit: Admitting: Urology

## 2024-08-09 ENCOUNTER — Ambulatory Visit: Admitting: Family
